# Patient Record
Sex: Female | Born: 1965 | Race: Black or African American | Hispanic: No | Marital: Single | Smoking: Never smoker
Health system: Southern US, Community
[De-identification: ages and names within clinical notes are randomized; demographics above are authoritative.]

## PROBLEM LIST (undated history)

## (undated) DIAGNOSIS — D573 Sickle-cell trait: Secondary | ICD-10-CM

## (undated) DIAGNOSIS — M5126 Other intervertebral disc displacement, lumbar region: Secondary | ICD-10-CM

## (undated) DIAGNOSIS — Z8619 Personal history of other infectious and parasitic diseases: Secondary | ICD-10-CM

## (undated) DIAGNOSIS — G4700 Insomnia, unspecified: Secondary | ICD-10-CM

## (undated) HISTORY — DX: Insomnia, unspecified: G47.00

## (undated) HISTORY — DX: Personal history of other infectious and parasitic diseases: Z86.19

## (undated) HISTORY — DX: Other intervertebral disc displacement, lumbar region: M51.26

---

## 2011-10-29 ENCOUNTER — Ambulatory Visit (INDEPENDENT_AMBULATORY_CARE_PROVIDER_SITE_OTHER): Payer: 59 | Admitting: Physician Assistant

## 2011-10-29 ENCOUNTER — Encounter: Payer: Self-pay | Admitting: Physician Assistant

## 2011-10-29 VITALS — BP 118/72 | HR 75 | Ht 67.0 in | Wt 172.0 lb

## 2011-10-29 DIAGNOSIS — M5126 Other intervertebral disc displacement, lumbar region: Secondary | ICD-10-CM | POA: Insufficient documentation

## 2011-10-29 DIAGNOSIS — Z7689 Persons encountering health services in other specified circumstances: Secondary | ICD-10-CM

## 2011-10-29 DIAGNOSIS — F5101 Primary insomnia: Secondary | ICD-10-CM | POA: Insufficient documentation

## 2011-10-29 DIAGNOSIS — J45909 Unspecified asthma, uncomplicated: Secondary | ICD-10-CM | POA: Insufficient documentation

## 2011-10-29 DIAGNOSIS — G4700 Insomnia, unspecified: Secondary | ICD-10-CM

## 2011-10-29 MED ORDER — ZOLPIDEM TARTRATE ER 6.25 MG PO TBCR: 6.2500 mg | EXTENDED_RELEASE_TABLET | Freq: Every evening | ORAL | Status: DC | PRN

## 2011-10-29 NOTE — Progress Notes (Signed)
  Subjective:    Patient ID: Angela Benton, female    DOB: [birth year], 46 y.o.   MRN: [id number]  HPI Patient presents to the clinic to establish care. We reviewed past medical history together. She does have a history of herniated disc at L4 and L5 but the pain is well controlled today and has not bother her for the past couple of years. She has recently had a job change and her job is much more stressful. She also had a shift change. She was working 3rd shift and now she is working 1 shift. She has been having problems sleeping since October 2012. She goes to sleep with no problem around 9 and then wakes up at 1 ready to go. She usually stays up until she goes to work. She then starts to really get tired around 5. She has tried melatonin, tylenol pm, Excedrin pm and they work to go to sleep but do not help her stay asleep. She denies any heat or cold intolerances, heart palpitations, weight loss, hair falling out, or history of problems with her thyroid. She eats red meat along with lots of green vegetables and beans. She has been on Ambien in the past and it did work. She did not take it every night but long enough to get sleep cycle started and then she slowly stopped taking medication.    Review of Systems     Objective:   Physical Exam  Constitutional: She is oriented to person, place, and time. She appears well-developed and well-nourished.  HENT:  Head: Normocephalic and atraumatic.  Cardiovascular: Normal rate, regular rhythm and normal heart sounds.   Pulmonary/Chest: Effort normal and breath sounds normal.  Neurological: She is alert and oriented to person, place, and time.  Psychiatric: She has a normal mood and affect. Her behavior is normal.          Assessment & Plan:  Insomnia- Ambien 6.25mg  at bedtime. Consider a sleep time routine that is the same every night and relaxing such as warm bath, washing face, reading a book. Follow up in 1 month.

## 2011-10-29 NOTE — Patient Instructions (Addendum)
Start Ambien at night and recheck in 1-2 months. Consider night time routine to prepare for bed with bath, book, calm down time.

## 2011-11-06 ENCOUNTER — Telehealth: Payer: Self-pay | Admitting: *Deleted

## 2011-11-06 MED ORDER — ZOLPIDEM TARTRATE 5 MG PO TABS: 5.0000 mg | ORAL_TABLET | Freq: Every evening | ORAL | Status: DC | PRN

## 2011-11-06 NOTE — Telephone Encounter (Signed)
LMOM informing Pt of change. Rx faxed to pharm

## 2011-11-06 NOTE — Telephone Encounter (Signed)
Prescription sent to printer. Will you please fax prescription to pharmacy and call patient and let her know that prescription has been sent for regular ambien.

## 2011-11-06 NOTE — Telephone Encounter (Signed)
Pt calls stating she is not sleeping well in Ambien CR and would like to go on Ambien. She is not getting any rest and needs sleep. Please advise,

## 2012-01-17 ENCOUNTER — Telehealth: Payer: Self-pay | Admitting: Cardiology

## 2012-01-18 ENCOUNTER — Other Ambulatory Visit: Payer: Self-pay | Admitting: *Deleted

## 2012-01-18 MED ORDER — ZOLPIDEM TARTRATE 5 MG PO TABS: 5.0000 mg | ORAL_TABLET | Freq: Every evening | ORAL | Status: DC | PRN

## 2012-04-07 ENCOUNTER — Other Ambulatory Visit: Payer: Self-pay | Admitting: *Deleted

## 2012-04-07 MED ORDER — ZOLPIDEM TARTRATE 5 MG PO TABS: 5.0000 mg | ORAL_TABLET | Freq: Every evening | ORAL | Status: DC | PRN

## 2012-06-20 ENCOUNTER — Ambulatory Visit: Payer: 59 | Admitting: Physician Assistant

## 2012-06-20 ENCOUNTER — Ambulatory Visit (INDEPENDENT_AMBULATORY_CARE_PROVIDER_SITE_OTHER): Payer: 59 | Admitting: Family Medicine

## 2012-06-20 ENCOUNTER — Encounter: Payer: Self-pay | Admitting: Family Medicine

## 2012-06-20 VITALS — BP 121/81 | HR 81 | Wt 170.0 lb

## 2012-06-20 DIAGNOSIS — D573 Sickle-cell trait: Secondary | ICD-10-CM | POA: Insufficient documentation

## 2012-06-20 DIAGNOSIS — R5383 Other fatigue: Secondary | ICD-10-CM

## 2012-06-20 DIAGNOSIS — R5381 Other malaise: Secondary | ICD-10-CM

## 2012-06-20 MED ORDER — ZOLPIDEM TARTRATE 5 MG PO TABS: 5.0000 mg | ORAL_TABLET | Freq: Every evening | ORAL | Status: DC | PRN

## 2012-06-20 NOTE — Progress Notes (Signed)
CC: Angela Benton is a 46 y.o. female is here for Fatigue   Subjective: HPI:  Patient presents for months of fatigue and "sluggishness" that she says feels identical to how she felt when she had low iron and low vitamin D in the remote past. Her work schedule is irregular and she feels that this might be playing into her fatigue but she is sleeping well using Ambien and feels well rested in the mornings. She has a history of heavy menstrual bleeding but periods have been much more controlled from a volume standpoint since a radiofrequency ablation years ago. She denies depression, anxiety, nor mental disturbance. She states her diet could be better and this maybe contributing to decreased vitamin and mineral intake. She admits to not getting much sun exposure and is not taking any vitamins or minerals over-the-counter. She denies a history of sleep apnea, snoring. She denies shortness of breath, irregular heartbeat, fevers, chills, orthopnea, unintentional weight gain, bleeding or bruising, swollen lymph nodes, nor trouble fighting infections.  She carries a diagnosis of insomnia and has been using Ambien as needed, and she's very happy with the results of this. She's requesting a refill.   Review Of Systems Outlined In HPI  Past Medical History  Diagnosis Date  . Herniated lumbar intervertebral disc     L4 and L5  . Insomnia      Family History  Problem Relation Age of Onset  . Diabetes Mother      History  Substance Use Topics  . Smoking status: Never Smoker   . Smokeless tobacco: Not on file  . Alcohol Use: No     Objective: Filed Vitals:   06/20/12 1043  BP: 121/81  Pulse: 81    General: Alert and Oriented, No Acute Distress HEENT: Pupils equal, round, reactive to light. Conjunctivae clear. Moist mucous membranes  Neck supple without palpable lymphadenopathy nor abnormal masses. Lungs: Clear to auscultation bilaterally, no wheezing/ronchi/rales.  Comfortable work of  breathing. Good air movement. Cardiac: Regular rate and rhythm. Normal S1/S2.  No murmurs, rubs, nor gallops.   Extremities: No peripheral edema.  Strong peripheral pulses.  Mental Status: No depression, anxiety, nor agitation. Skin: Warm and dry.  Assessment & Plan: Angela Benton was seen today for fatigue.  Diagnoses and associated orders for this visit:  Sickle cell trait  Fatigue - Iron and TIBC - CBC - Vitamin D 25 hydroxy - Folate - B12  Other Orders - zolpidem (AMBIEN) 5 MG tablet; Take 1 tablet (5 mg total) by mouth at bedtime as needed for sleep.    she's not interested in a TSH, she's closely states she's only interested in determining if supplements are necessary for the above labs. His history of sickle cell trait. We'll check CBC to rule out anemia. She would like results called to her 491 cell phone.  Refill of Ambien a call her with results once available on Monday   Return if symptoms worsen or fail to improve.

## 2012-06-21 LAB — IRON AND TIBC: Iron: 56 ug/dL (ref 42–145)

## 2012-06-21 LAB — CBC
HCT: 41.1 % (ref 36.0–46.0)
MCHC: 35 g/dL (ref 30.0–36.0)
MCV: 77.3 fL — ABNORMAL LOW (ref 78.0–100.0)
RDW: 15 % (ref 11.5–15.5)
WBC: 9.1 10*3/uL (ref 4.0–10.5)

## 2012-06-21 LAB — VITAMIN B12: Vitamin B-12: 405 pg/mL (ref 211–911)

## 2012-06-24 ENCOUNTER — Telehealth: Payer: Self-pay | Admitting: Family Medicine

## 2012-06-24 DIAGNOSIS — E559 Vitamin D deficiency, unspecified: Secondary | ICD-10-CM | POA: Insufficient documentation

## 2012-06-24 MED ORDER — ERGOCALCIFEROL 1.25 MG (50000 UT) PO CAPS: ORAL_CAPSULE | ORAL | Status: DC

## 2012-06-24 NOTE — Telephone Encounter (Signed)
Discussed favorable labs with patient, VitD could be contributing to fatigue, will recheck after 8 weeks of 50k weekly regimen.

## 2012-08-25 ENCOUNTER — Telehealth: Payer: Self-pay | Admitting: Family Medicine

## 2012-08-25 MED ORDER — ZOLPIDEM TARTRATE 5 MG PO TABS: 5.0000 mg | ORAL_TABLET | Freq: Every evening | ORAL | Status: DC | PRN

## 2012-08-25 NOTE — Telephone Encounter (Signed)
Message copied by Laren Boom on Mon Aug 25, 2012  4:42 PM ------      Message from: [facility]      Created: Mon Aug 25, 2012  1:55 PM      Regarding: Cassondra Kube       Hello Dr. Ivan Anchors:      Could you send a refill on my Ambien to [facility].      Angela Benton

## 2012-08-25 NOTE — Telephone Encounter (Signed)
Hello Dr. Ivan Anchors: Could you send a refill on my Ambien to [facility]. Lorita

## 2012-10-14 ENCOUNTER — Encounter: Payer: Self-pay | Admitting: Family Medicine

## 2012-10-14 ENCOUNTER — Ambulatory Visit (INDEPENDENT_AMBULATORY_CARE_PROVIDER_SITE_OTHER): Payer: 59 | Admitting: Family Medicine

## 2012-10-14 ENCOUNTER — Telehealth: Payer: Self-pay | Admitting: *Deleted

## 2012-10-14 VITALS — BP 131/79 | HR 89 | Temp 98.0°F | Wt 170.0 lb

## 2012-10-14 DIAGNOSIS — B9689 Other specified bacterial agents as the cause of diseases classified elsewhere: Secondary | ICD-10-CM

## 2012-10-14 DIAGNOSIS — G4700 Insomnia, unspecified: Secondary | ICD-10-CM

## 2012-10-14 DIAGNOSIS — A499 Bacterial infection, unspecified: Secondary | ICD-10-CM

## 2012-10-14 DIAGNOSIS — J329 Chronic sinusitis, unspecified: Secondary | ICD-10-CM

## 2012-10-14 MED ORDER — ZOLPIDEM TARTRATE 10 MG PO TABS: 10.0000 mg | ORAL_TABLET | Freq: Every evening | ORAL | Status: DC | PRN

## 2012-10-14 MED ORDER — DOXYCYCLINE HYCLATE 100 MG PO TABS: ORAL_TABLET | ORAL | Status: AC

## 2012-10-14 MED ORDER — PSEUDOEPHEDRINE-GUAIFENESIN ER 60-600 MG PO TB12: ORAL_TABLET | ORAL | Status: DC

## 2012-10-14 MED ORDER — ZOLPIDEM TARTRATE 5 MG PO TABS: 5.0000 mg | ORAL_TABLET | Freq: Every evening | ORAL | Status: DC | PRN

## 2012-10-14 NOTE — Progress Notes (Signed)
CC: Angela Benton is a 47 y.o. female is here for URI   Subjective: HPI:  Patient presents with concerns of facial pressure and nasal congestion. This is been present for 2 days and rapidly getting worse. Is described as moderate to severe in severity. It is worse when lying down. Nothing seems to make it better or worse otherwise. She has tried Alka-Seltzer products without improvement. No other interventions. The nasal congestion is getting in the way of her sleep. She has trouble breathing out of her nose and has "continuous" nasal discharge out of both nares. Facial pressure is localized in the for head and nonradiating. She denies ocular or ear complaints. She denies cough, shortness of breath, chest pain, orthopnea. She has had subjective chills and fatigue but no fevers. No night sweats.  She mentions that the 5 mg of Ambien has not been effective as it has in the past. This past month she has had to take 2 tablets before bed less than most nights of the week. Biggest trouble seems to be getting to sleep rather than staying asleep in less she takes 10 mg total. Nothing makes insomnia better or worse other than that above.    Review Of Systems Outlined In HPI  Past Medical History  Diagnosis Date  . Herniated lumbar intervertebral disc     L4 and L5  . Insomnia      Family History  Problem Relation Age of Onset  . Diabetes Mother      History  Substance Use Topics  . Smoking status: Never Smoker   . Smokeless tobacco: Not on file  . Alcohol Use: No     Objective: Filed Vitals:   10/14/12 0959  BP: 131/79  Pulse: 89  Temp: 98 F (36.7 C)    General: Alert and Oriented, No Acute Distress HEENT: Pupils equal, round, reactive to light. Conjunctivae clear.  External ears unremarkable, canals clear with intact TMs with appropriate landmarks.  Middle ear appears open without effusion. Boggy and erythematous inferior turbinates with moderate mucoid discharge.  Moist mucous  membranes, pharynx without inflammation nor lesions.  Neck supple without palpable lymphadenopathy nor abnormal masses. Lungs: Clear to auscultation bilaterally, no wheezing/ronchi/rales.  Comfortable work of breathing. Good air movement. Cardiac: Regular rate and rhythm. Normal S1/S2.  No murmurs, rubs, nor gallops.   Extremities: No peripheral edema.  Strong peripheral pulses.  Mental Status: No depression, anxiety, nor agitation. Skin: Warm and dry.  Assessment & Plan: Angela Benton was seen today for uri.  Diagnoses and associated orders for this visit:  Insomnia - zolpidem (AMBIEN) 10 MG tablet; Take 1 tablet (10 mg total) by mouth at bedtime as needed for sleep.  Bacterial sinusitis - doxycycline (VIBRA-TABS) 100 MG tablet; One by mouth twice a day for ten days. - pseudoephedrine-guaifenesin (MUCINEX D) 60-600 MG per tablet; One by mouth twice a day for congestion.  Sinusitis: Discussed with patient that most likely viral in etiology symptomatic care would include continued Alka-Seltzer products and/or Mucinex D for congestion.  If present for 7-10 days or worsening fevers can consider starting doxycycline. Encouraged to use nasal saline washes for the time being. Insomnia: Uncontrolled, titrate up to 10 mg as needed every evening.   Return if symptoms worsen or fail to improve.

## 2012-11-10 ENCOUNTER — Other Ambulatory Visit: Payer: Self-pay | Admitting: *Deleted

## 2012-11-10 DIAGNOSIS — G4700 Insomnia, unspecified: Secondary | ICD-10-CM

## 2012-11-10 MED ORDER — ZOLPIDEM TARTRATE 10 MG PO TABS: 10.0000 mg | ORAL_TABLET | Freq: Every evening | ORAL | Status: DC | PRN

## 2012-12-08 ENCOUNTER — Other Ambulatory Visit: Payer: Self-pay | Admitting: *Deleted

## 2012-12-08 DIAGNOSIS — G4700 Insomnia, unspecified: Secondary | ICD-10-CM

## 2012-12-11 ENCOUNTER — Other Ambulatory Visit: Payer: Self-pay | Admitting: *Deleted

## 2012-12-11 DIAGNOSIS — G4700 Insomnia, unspecified: Secondary | ICD-10-CM

## 2012-12-11 MED ORDER — ZOLPIDEM TARTRATE 10 MG PO TABS: 10.0000 mg | ORAL_TABLET | Freq: Every evening | ORAL | Status: DC | PRN

## 2013-01-08 ENCOUNTER — Other Ambulatory Visit: Payer: Self-pay | Admitting: Family Medicine

## 2013-01-08 DIAGNOSIS — G4700 Insomnia, unspecified: Secondary | ICD-10-CM

## 2013-01-08 NOTE — Telephone Encounter (Signed)
Sue Lush, Can you please let mrs. Charbonneau know that this Rx requires follow up every 3 months, I'll print out one for this month to give her time for follow up.

## 2013-01-08 NOTE — Telephone Encounter (Signed)
Pt notified and rx faxed 

## 2013-01-08 NOTE — Telephone Encounter (Signed)
Patient called req to get a refill for her prescription but would like to know if she can get a 2 month prescription instead of 1 month called into pharmacy... Her wrk number [phone] ext [phone]. Just said if you can call her and just let her know prescription has been called in she hasnt slept in 2 days. Thanks

## 2013-01-15 ENCOUNTER — Encounter: Payer: Self-pay | Admitting: Family Medicine

## 2013-01-15 ENCOUNTER — Ambulatory Visit (INDEPENDENT_AMBULATORY_CARE_PROVIDER_SITE_OTHER): Payer: 59 | Admitting: Family Medicine

## 2013-01-15 VITALS — BP 110/64 | HR 92 | Wt 170.0 lb

## 2013-01-15 DIAGNOSIS — G4700 Insomnia, unspecified: Secondary | ICD-10-CM

## 2013-01-15 MED ORDER — ZOLPIDEM TARTRATE 10 MG PO TABS: 10.0000 mg | ORAL_TABLET | Freq: Every evening | ORAL | Status: DC | PRN

## 2013-01-15 NOTE — Progress Notes (Signed)
CC: Angela Benton is a 47 y.o. female is here for f/u ambien   Subjective: HPI:  Followup insomnia: At last visit increased Ambien 5 to 10 mg nightly. Reports significant improvement in insomnia, no longer trouble falling asleep or staying asleep. Reports once answering the phone and the other party report she was talking about aliens, otherwise no reports of sleep walking or dangerous situations after taking Ambien. She's been taking it the moment she's gets in the bed. Denies depression, anxiety, paranoia, hallucinations, mental disturbance    Review Of Systems Outlined In HPI  Past Medical History  Diagnosis Date  . Herniated lumbar intervertebral disc     L4 and L5  . Insomnia      Family History  Problem Relation Age of Onset  . Diabetes Mother      History  Substance Use Topics  . Smoking status: Never Smoker   . Smokeless tobacco: Not on file  . Alcohol Use: No     Objective: Filed Vitals:   01/15/13 0913  BP: 110/64  Pulse: 92    Vital signs reviewed. General: Alert and Oriented, No Acute Distress HEENT: Pupils equal, round, reactive to light. Conjunctivae clear.  External ears unremarkable.  Moist mucous membranes. Lungs: Clear and comfortable work of breathing, speaking in full sentences without accessory muscle use. No wheezing rhonchi nor rales Cardiac: Regular rate and rhythm. No murmurs nor carotid bruit Neuro: CN II-XII grossly intact, gait normal. Extremities: No peripheral edema.  Strong peripheral pulses.  Mental Status: No depression, anxiety, nor agitation. Logical though process. Skin: Warm and dry.  Assessment & Plan: Angela Benton was seen today for f/u ambien.  Diagnoses and associated orders for this visit:  Insomnia - zolpidem (AMBIEN) 10 MG tablet; Take 1 tablet (10 mg total) by mouth at bedtime as needed for sleep.    Insomnia: Improved and controlled, continue as needed Ambien, take only once in bed ready for sleep  Return in about 3  months (around 04/17/2013).

## 2013-02-12 ENCOUNTER — Encounter: Payer: Self-pay | Admitting: Family Medicine

## 2013-03-24 ENCOUNTER — Ambulatory Visit: Payer: Self-pay | Admitting: Family Medicine

## 2013-04-16 ENCOUNTER — Ambulatory Visit: Payer: 59 | Admitting: Family Medicine

## 2013-04-17 ENCOUNTER — Encounter: Payer: Self-pay | Admitting: Family Medicine

## 2013-04-17 ENCOUNTER — Ambulatory Visit (INDEPENDENT_AMBULATORY_CARE_PROVIDER_SITE_OTHER): Payer: 59 | Admitting: Family Medicine

## 2013-04-17 VITALS — BP 111/77 | HR 73 | Wt 172.0 lb

## 2013-04-17 DIAGNOSIS — Z13220 Encounter for screening for lipoid disorders: Secondary | ICD-10-CM

## 2013-04-17 DIAGNOSIS — G4700 Insomnia, unspecified: Secondary | ICD-10-CM

## 2013-04-17 DIAGNOSIS — Z131 Encounter for screening for diabetes mellitus: Secondary | ICD-10-CM

## 2013-04-17 MED ORDER — ZOLPIDEM TARTRATE 10 MG PO TABS: 10.0000 mg | ORAL_TABLET | Freq: Every evening | ORAL | Status: DC | PRN

## 2013-04-17 NOTE — Progress Notes (Signed)
CC: Angela Benton is a 47 y.o. female is here for Insomnia   Subjective: HPI:  Followup insomnia: Since increasing Ambien to 10 mg he has noticed great improvement with getting to sleep and staying asleep estimated 6-7 hours a night feeling well rested in the morning without grogginess. She denies memory loss, mental disturbance nor sleep walking. She occasionally takes 5 mg and has been getting success with that as well. Symptoms are worsened with a regular work schedule.   Review Of Systems Outlined In HPI  Past Medical History  Diagnosis Date  . Herniated lumbar intervertebral disc     L4 and L5  . Insomnia      Family History  Problem Relation Age of Onset  . Diabetes Mother      History  Substance Use Topics  . Smoking status: Never Smoker   . Smokeless tobacco: Not on file  . Alcohol Use: No     Objective: Filed Vitals:   04/17/13 1106  BP: 111/77  Pulse: 73    Vital signs reviewed. General: Alert and Oriented, No Acute Distress HEENT: Pupils equal, round, reactive to light. Conjunctivae clear.  External ears unremarkable.  Moist mucous membranes. Lungs: Clear and comfortable work of breathing, speaking in full sentences without accessory muscle use. Cardiac: Regular rate and rhythm.  Neuro: CN II-XII grossly intact, gait normal. Extremities: No peripheral edema.  Strong peripheral pulses.  Mental Status: No depression, anxiety, nor agitation. Logical though process. Skin: Warm and dry. Assessment & Plan: [name] was seen today for insomnia.  Diagnoses and associated orders for this visit:  Insomnia - zolpidem (AMBIEN) 10 MG tablet; Take 1 tablet (10 mg total) by mouth at bedtime as needed for sleep.  Lipid screening - Lipid panel  Diabetes mellitus screening - BASIC METABOLIC PANEL WITH GFR    Insomnia: Controlled continue as needed Ambien we discussed possibly tapering off this in the near future when work schedule is more predictable  She's never  had a lipid screening or diabetic screen with [place] she will have this done at her convenience in the future fasting  Return in about 3 months (around 07/18/2013).

## 2013-05-10 ENCOUNTER — Telehealth: Payer: Self-pay | Admitting: Family Medicine

## 2013-05-10 MED ORDER — POLYMYXIN B-TRIMETHOPRIM 10000-0.1 UNIT/ML-% OP SOLN: 1.0000 [drp] | OPHTHALMIC | Status: DC

## 2013-05-10 NOTE — ED Notes (Signed)
L eye irritation x 2 days with mild drainage  No eye pain  No LOV.  Will Rx polytrim for infectious coverage if sxs worsen.  Likely viral conjunctivitis.   Doree Albee, MD 05/10/13 1314

## 2013-07-02 ENCOUNTER — Ambulatory Visit (HOSPITAL_COMMUNITY): Payer: 59 | Admitting: Behavioral Health

## 2013-07-09 ENCOUNTER — Other Ambulatory Visit: Payer: Self-pay

## 2013-07-10 ENCOUNTER — Encounter: Payer: Self-pay | Admitting: Family Medicine

## 2013-07-10 ENCOUNTER — Ambulatory Visit (INDEPENDENT_AMBULATORY_CARE_PROVIDER_SITE_OTHER): Payer: 59 | Admitting: Family Medicine

## 2013-07-10 VITALS — BP 130/79 | HR 85 | Wt 176.0 lb

## 2013-07-10 DIAGNOSIS — G4700 Insomnia, unspecified: Secondary | ICD-10-CM

## 2013-07-10 DIAGNOSIS — M5126 Other intervertebral disc displacement, lumbar region: Secondary | ICD-10-CM

## 2013-07-10 MED ORDER — ZOLPIDEM TARTRATE 10 MG PO TABS: 10.0000 mg | ORAL_TABLET | Freq: Every evening | ORAL | Status: DC | PRN

## 2013-07-10 MED ORDER — CYCLOBENZAPRINE HCL 10 MG PO TABS: ORAL_TABLET | ORAL | Status: DC

## 2013-07-10 MED ORDER — NAPROXEN 500 MG PO TBEC: 500.0000 mg | DELAYED_RELEASE_TABLET | Freq: Two times a day (BID) | ORAL | Status: DC

## 2013-07-10 NOTE — Progress Notes (Signed)
CC: Angela Benton is a 47 y.o. female is here for f/u ambien   Subjective: HPI:  F/U insomnia: Continues on Ambien 10 mg the site of the week getting 7 hours of sleep no known side effects. Feels well rested in the morning. No sleep walking or nightmares. If She does not take full dose she will only get 3 hours of sleep. No mental disturbance anxiety no depression.  Requesting refills on Flexeril and Naprosyn For low back pain attributed to herniated disc no new neurologic issues, no weakness no radiation of pain no bowel or bladder dysfunction. Overall character and severity of pain has been stable over the past 3 years    Review Of Systems Outlined In HPI  Past Medical History  Diagnosis Date  . Herniated lumbar intervertebral disc     L4 and L5  . Insomnia      Family History  Problem Relation Age of Onset  . Diabetes Mother      History  Substance Use Topics  . Smoking status: Never Smoker   . Smokeless tobacco: Not on file  . Alcohol Use: No     Objective: Filed Vitals:   07/10/13 0833  BP: 130/79  Pulse: 85    Vital signs reviewed. General: Alert and Oriented, No Acute Distress HEENT: Pupils equal, round, reactive to light. Conjunctivae clear.  External ears unremarkable.  Moist mucous membranes. Lungs: Clear and comfortable work of breathing, speaking in full sentences without accessory muscle use. Cardiac: Regular rate and rhythm.  Neuro: CN II-XII grossly intact, gait normal. Extremities: No peripheral edema.  Strong peripheral pulses.  Mental Status: No depression, anxiety, nor agitation. Logical though process. Skin: Warm and dry.  Assessment & Plan: Angela Benton was seen today for f/u ambien.  Diagnoses and associated orders for this visit:  Insomnia - zolpidem (AMBIEN) 10 MG tablet; Take 1 tablet (10 mg total) by mouth at bedtime as needed for sleep. - naproxen (EC NAPROSYN) 500 MG EC tablet; Take 1 tablet (500 mg total) by mouth 2 (two) times daily with  a meal.  Herniated lumbar intervertebral disc - cyclobenzaprine (FLEXERIL) 10 MG tablet; Take a half to a full tab every 8-12 hours only as needed for back pain, may cause sedation.    Insomnia: Well-controlled continue Ambien as needed   herniated disc: Stable continue as needed Flexeril and Naprosyn.Signs and symptoms requring emergent/urgent reevaluation were discussed with the patient.  Return in about 3 months (around 10/10/2013).

## 2013-07-17 ENCOUNTER — Ambulatory Visit: Payer: 59 | Admitting: Family Medicine

## 2013-07-28 ENCOUNTER — Other Ambulatory Visit: Payer: Self-pay | Admitting: Obstetrics & Gynecology

## 2013-07-28 MED ORDER — AMOXICILLIN-POT CLAVULANATE 875-125 MG PO TABS: 1.0000 | ORAL_TABLET | Freq: Two times a day (BID) | ORAL | Status: DC

## 2013-07-28 NOTE — Progress Notes (Signed)
Pt has right upper tooth abscess.  Pt seeing a dentist but doesn't have an appt for extraction until next Wednesday.  Will start Augmentin bid for 10 days.  If spikes fever or feels worse, pt to call dentist or go to ED.

## 2013-08-25 ENCOUNTER — Telehealth: Payer: Self-pay | Admitting: *Deleted

## 2013-08-25 DIAGNOSIS — N898 Other specified noninflammatory disorders of vagina: Secondary | ICD-10-CM

## 2013-08-25 MED ORDER — FLUCONAZOLE 150 MG PO TABS: 150.0000 mg | ORAL_TABLET | Freq: Once | ORAL | Status: DC

## 2013-08-25 MED ORDER — METRONIDAZOLE 500 MG PO TABS: 500.0000 mg | ORAL_TABLET | Freq: Two times a day (BID) | ORAL | Status: DC

## 2013-08-25 NOTE — Telephone Encounter (Signed)
Pt states she has bacterial infection - will send meds to pharm

## 2013-10-09 ENCOUNTER — Ambulatory Visit (INDEPENDENT_AMBULATORY_CARE_PROVIDER_SITE_OTHER): Payer: 59 | Admitting: Family Medicine

## 2013-10-09 ENCOUNTER — Encounter: Payer: Self-pay | Admitting: Family Medicine

## 2013-10-09 VITALS — BP 152/91 | HR 98 | Wt 181.0 lb

## 2013-10-09 DIAGNOSIS — IMO0001 Reserved for inherently not codable concepts without codable children: Secondary | ICD-10-CM

## 2013-10-09 DIAGNOSIS — G4700 Insomnia, unspecified: Secondary | ICD-10-CM

## 2013-10-09 DIAGNOSIS — R030 Elevated blood-pressure reading, without diagnosis of hypertension: Secondary | ICD-10-CM

## 2013-10-09 MED ORDER — ZOLPIDEM TARTRATE 10 MG PO TABS: 10.0000 mg | ORAL_TABLET | Freq: Every evening | ORAL | Status: DC | PRN

## 2013-10-09 NOTE — Progress Notes (Signed)
CC: Angela Benton is a 48 y.o. female is here for f/u ambien   Subjective: HPI:  Followup insomnia: Continues to take him to a nightly basis. She denies any sleepwalking, mental disturbance, anxiety, depression.  She will occasionally not take this medication however she finds that she has difficulty with getting to sleep and staying asleep on these days. She is quite happy with her current medication regimen.  Denies nonrestorative sleep, fatigue.   elevated blood pressure: She has never been on antihypertensives. She states that she did have a large meal at [employer] last night.  She exercises most days of the week. Denies chest pain shortness of breath or motor sensory disturbances     Review Of Systems Outlined In HPI  Past Medical History  Diagnosis Date  . Herniated lumbar intervertebral disc     L4 and L5  . Insomnia     Past Surgical History  Procedure Laterality Date  . Cesarean section     Family History  Problem Relation Age of Onset  . Diabetes Mother     History   Social History  . Marital Status: Single    Spouse Name: N/A    Number of Children: N/A  . Years of Education: N/A   Occupational History  . Not on file.   Social History Main Topics  . Smoking status: Never Smoker   . Smokeless tobacco: Not on file  . Alcohol Use: No  . Drug Use: No  . Sexual Activity: Not Currently   Other Topics Concern  . Not on file   Social History Narrative  . No narrative on file     Objective: BP 152/91  Pulse 98  Wt 181 lb (82.101 kg)  General: Alert and Oriented, No Acute Distress HEENT: Pupils equal, round, reactive to light. Conjunctivae clear.   moist membranes  Lungs: Clear to auscultation bilaterally, no wheezing/ronchi/rales.  Comfortable work of breathing. Good air movement. Cardiac: Regular rate and rhythm. Normal S1/S2.  No murmurs, rubs, nor gallops.   Mental Status: No depression, anxiety, nor agitation. Skin: Warm and dry.  Assessment &  Plan: Angela Benton was seen today for f/u ambien.  Diagnoses and associated orders for this visit:  Insomnia - zolpidem (AMBIEN) 10 MG tablet; Take 1 tablet (10 mg total) by mouth at bedtime as needed for sleep.  Elevated blood pressure    Insomnia: Controlled continue Ambien Elevated blood pressure: We discussed salt reduction in physical activity to help lower blood pressure. Asked her to drop and on one for breaks to have her blood pressure check sometime in the near future, highly suspect this is due to her meal last night  Return in about 3 months (around 01/06/2014).

## 2014-01-01 ENCOUNTER — Ambulatory Visit (INDEPENDENT_AMBULATORY_CARE_PROVIDER_SITE_OTHER): Payer: 59 | Admitting: Family Medicine

## 2014-01-01 ENCOUNTER — Encounter: Payer: Self-pay | Admitting: Family Medicine

## 2014-01-01 VITALS — BP 146/87 | HR 98 | Ht 67.0 in | Wt 188.0 lb

## 2014-01-01 DIAGNOSIS — Z131 Encounter for screening for diabetes mellitus: Secondary | ICD-10-CM

## 2014-01-01 DIAGNOSIS — Z0000 Encounter for general adult medical examination without abnormal findings: Secondary | ICD-10-CM

## 2014-01-01 DIAGNOSIS — E669 Obesity, unspecified: Secondary | ICD-10-CM

## 2014-01-01 DIAGNOSIS — G4700 Insomnia, unspecified: Secondary | ICD-10-CM

## 2014-01-01 DIAGNOSIS — Z13220 Encounter for screening for lipoid disorders: Secondary | ICD-10-CM

## 2014-01-01 MED ORDER — ZOLPIDEM TARTRATE 10 MG PO TABS: 10.0000 mg | ORAL_TABLET | Freq: Every evening | ORAL | Status: DC | PRN

## 2014-01-01 NOTE — Progress Notes (Signed)
Colonoscopy:No family history colon cancer, will begin screening age 48 Papsmear: Over a year, she plans on having this done with her OB/GYN in the next year. Mammogram: She would prefer to wait until age 48 to begin these screenings  Influenza Vaccine: Up-to-date Pneumovax: No current indication Td/Tdap: Tdap 2012 Zoster: (Start 48 yo)   CC: Angela Benton is a 48 y.o. female is here for No chief complaint on file.   Subjective: HPI:  Presents for physical with acute complaint of difficulty losing weight over the past months. No formal exercise routine. She admits difficulty with curving cravings for pancakes and soda.   No alcohol, tobacco, nor recreational drug use.  Review of Systems - General ROS: negative for - chills, fever, night sweats, weight gain or weight loss Ophthalmic ROS: negative for - decreased vision Psychological ROS: negative for - anxiety or depression ENT ROS: negative for - hearing change, nasal congestion, tinnitus or allergies Hematological and Lymphatic ROS: negative for - bleeding problems, bruising or swollen lymph nodes Breast ROS: negative Respiratory ROS: no cough, shortness of breath, or wheezing Cardiovascular ROS: no chest pain or dyspnea on exertion Gastrointestinal ROS: no abdominal pain, change in bowel habits, or black or bloody stools Genito-Urinary ROS: negative for - genital discharge, genital ulcers, incontinence or abnormal bleeding from genitals Musculoskeletal ROS: negative for - joint pain or muscle pain Neurological ROS: negative for - headaches or memory loss Dermatological ROS: negative for lumps, mole changes, rash and skin lesion changes  Past Medical History  Diagnosis Date  . Herniated lumbar intervertebral disc     L4 and L5  . Insomnia     Past Surgical History  Procedure Laterality Date  . Cesarean section     Family History  Problem Relation Age of Onset  . Diabetes Mother     History   Social History  .  Marital Status: Single    Spouse Name: N/A    Number of Children: N/A  . Years of Education: N/A   Occupational History  . Not on file.   Social History Main Topics  . Smoking status: Never Smoker   . Smokeless tobacco: Not on file  . Alcohol Use: No  . Drug Use: No  . Sexual Activity: Not Currently   Other Topics Concern  . Not on file   Social History Narrative  . No narrative on file     Objective: BP 146/87  Pulse 98  Ht 5\' 7"  (1.702 m)  Wt 188 lb (85.276 kg)  BMI 29.44 kg/m2  General: No Acute Distress HEENT: Atraumatic, normocephalic, conjunctivae normal without scleral icterus.  No nasal discharge, hearing grossly intact, TMs with good landmarks bilaterally with no middle ear abnormalities, posterior pharynx clear without oral lesions. Neck: Supple, trachea midline, no cervical nor supraclavicular adenopathy. Pulmonary: Clear to auscultation bilaterally without wheezing, rhonchi, nor rales. Cardiac: Regular rate and rhythm.  No murmurs, rubs, nor gallops. No peripheral edema.  2+ peripheral pulses bilaterally. Abdomen: Bowel sounds normal.  No masses.  Non-tender without rebound.  Negative Murphy's sign. GU: Deferred to her GYN appointment  MSK: Grossly intact, no signs of weakness.  Full strength throughout upper and lower extremities.  Full ROM in upper and lower extremities.  No midline spinal tenderness. Neuro: Gait unremarkable, CN II-XII grossly intact.  C5-C6 Reflex 2/4 Bilaterally, L4 Reflex 2/4 Bilaterally.  Cerebellar function intact. Skin: No rashes. Psych: Alert and oriented to person/place/time.  Thought process normal. No anxiety/depression.   Assessment &  Plan: Angela Benton was seen today for no specified reason.  Diagnoses and associated orders for this visit:  Annual physical exam  Lipid screening - Lipid panel  Diabetes mellitus screening - BASIC METABOLIC PANEL WITH GFR  Insomnia - zolpidem (AMBIEN) 10 MG tablet; Take 1 tablet (10 mg total)  by mouth at bedtime as needed for sleep.  Obesity - Ambulatory referral to diabetic education    Healthy lifestyle interventions including but not limited to regular exercise, a healthy low fat diet, moderation of salt intake, the dangers of tobacco/alcohol/recreational drug use, nutrition supplementation, and accident avoidance were discussed with the patient and a handout was provided for future reference. She'll return at her convenience for dyslipidemia and diabetic screening. Referral to nutrition has been placed for her request, I've encouraged her to look into [employer], and to cut out soda 100%  Return in about 3 months (around 04/03/2014).

## 2014-02-01 ENCOUNTER — Encounter: Payer: Self-pay | Admitting: Sports Medicine

## 2014-02-01 ENCOUNTER — Ambulatory Visit (INDEPENDENT_AMBULATORY_CARE_PROVIDER_SITE_OTHER): Payer: 59

## 2014-02-01 ENCOUNTER — Ambulatory Visit (INDEPENDENT_AMBULATORY_CARE_PROVIDER_SITE_OTHER): Payer: 59 | Admitting: Sports Medicine

## 2014-02-01 VITALS — BP 135/83 | HR 103 | Wt 188.0 lb

## 2014-02-01 DIAGNOSIS — IMO0002 Reserved for concepts with insufficient information to code with codable children: Secondary | ICD-10-CM

## 2014-02-01 DIAGNOSIS — M5137 Other intervertebral disc degeneration, lumbosacral region: Secondary | ICD-10-CM

## 2014-02-01 DIAGNOSIS — M5380 Other specified dorsopathies, site unspecified: Secondary | ICD-10-CM

## 2014-02-01 DIAGNOSIS — M5416 Radiculopathy, lumbar region: Secondary | ICD-10-CM | POA: Insufficient documentation

## 2014-02-01 MED ORDER — PREDNISONE 50 MG PO TABS: ORAL_TABLET | ORAL | Status: DC

## 2014-02-01 MED ORDER — CYCLOBENZAPRINE HCL 10 MG PO TABS: ORAL_TABLET | ORAL | Status: DC

## 2014-02-01 NOTE — Assessment & Plan Note (Signed)
Symptoms are classic for a right-sided L5 radiculitis. Formal physical therapy, prednisone, continue naproxen, refilling cyclobenzaprine. Lumbar spine x-rays. Return to see me in one month.

## 2014-02-01 NOTE — Progress Notes (Signed)
   Subjective:    I'm seeing this patient as a consultation for:  Dr. Ivan Anchors  CC: Low back pain  HPI: This is a pleasant 48 year old female, for the past several weeks she's had increasing pain in her low back, which is minimal, her predominant symptom is right leg pain she describes an L5 distribution. It sounds as though in her 9 years ago she had an MRI that showed L4-5 and L5-S1 disc disease. Symptoms are worse with sitting, flexion, Valsalva. No bowel or bladder dysfunction, no saddle numbness.  Past medical history, Surgical history, Family history not pertinant except as noted below, Social history, Allergies, and medications have been entered into the medical record, reviewed, and no changes needed.   Review of Systems: No headache, visual changes, nausea, vomiting, diarrhea, constipation, dizziness, abdominal pain, skin rash, fevers, chills, night sweats, weight loss, swollen lymph nodes, body aches, joint swelling, muscle aches, chest pain, shortness of breath, mood changes, visual or auditory hallucinations.   Objective:   General: Well Developed, well nourished, and in no acute distress.  Neuro/Psych: Alert and oriented x3, extra-ocular muscles intact, able to move all 4 extremities, sensation grossly intact. Skin: Warm and dry, no rashes noted.  Respiratory: Not using accessory muscles, speaking in full sentences, trachea midline.  Cardiovascular: Pulses palpable, no extremity edema. Abdomen: Does not appear distended. Back Exam:  Inspection: Unremarkable  Motion: Flexion 45 deg, Extension 45 deg, Side Bending to 45 deg bilaterally,  Rotation to 45 deg bilaterally  SLR laying: Negative  XSLR laying: Negative  Palpable tenderness: None. FABER: negative. Sensory change: Gross sensation intact to all lumbar and sacral dermatomes.  Reflexes: 2+ at both patellar tendons, 2+ at achilles tendons, Babinski's downgoing.  Strength at foot  Plantar-flexion: 5/5 Dorsi-flexion: 5/5  Eversion: 5/5 Inversion: 5/5  Leg strength  Quad: 5/5 Hamstring: 5/5 Hip flexor: 5/5 Hip abductors: 5/5  Gait unremarkable.   X-rays personally reviewed show a significant decrease in disc space at the L4-L5 level with multilevel anterior disc osteophytic spurring.  Impression and Recommendations:   This case required medical decision making of moderate complexity.

## 2014-02-03 ENCOUNTER — Ambulatory Visit: Payer: 59 | Admitting: *Deleted

## 2014-02-03 DIAGNOSIS — N390 Urinary tract infection, site not specified: Secondary | ICD-10-CM

## 2014-02-03 NOTE — Progress Notes (Signed)
Pt feels pressure when urinating.

## 2014-02-04 LAB — POCT URINALYSIS DIP (DEVICE)
Glucose, UA: NEGATIVE mg/dL
LEUKOCYTES UA: NEGATIVE
NITRITE: NEGATIVE
PH: 6 (ref 5.0–8.0)
PROTEIN: 100 mg/dL — AB
Specific Gravity, Urine: >=1.03 (ref 1.005–1.030)
Urobilinogen, UA: 0.2 mg/dL (ref 0.0–1.0)

## 2014-02-06 LAB — URINE CULTURE: Colony Count: 50000

## 2014-02-19 NOTE — Telephone Encounter (Signed)
done

## 2014-04-01 LAB — LIPID PANEL
CHOL/HDL RATIO: 4.1 ratio
Cholesterol: 158 mg/dL (ref 0–200)
HDL: 39 mg/dL — ABNORMAL LOW (ref >39)
LDL Cholesterol: 97 mg/dL (ref 0–99)
Triglycerides: 109 mg/dL (ref <150)
VLDL: 22 mg/dL (ref 0–40)

## 2014-04-01 LAB — BASIC METABOLIC PANEL WITH GFR
BUN: 6 mg/dL (ref 6–23)
CO2: 26 mEq/L (ref 19–32)
Calcium: 9.2 mg/dL (ref 8.4–10.5)
Chloride: 105 mEq/L (ref 96–112)
Creat: 0.63 mg/dL (ref 0.50–1.10)
Glucose, Bld: 87 mg/dL (ref 70–99)
POTASSIUM: 3.9 meq/L (ref 3.5–5.3)
SODIUM: 139 meq/L (ref 135–145)

## 2014-04-06 ENCOUNTER — Ambulatory Visit (INDEPENDENT_AMBULATORY_CARE_PROVIDER_SITE_OTHER): Payer: 59 | Admitting: Family Medicine

## 2014-04-06 ENCOUNTER — Encounter: Payer: Self-pay | Admitting: Family Medicine

## 2014-04-06 VITALS — BP 134/83 | HR 84 | Ht 67.0 in | Wt 192.0 lb

## 2014-04-06 DIAGNOSIS — M5416 Radiculopathy, lumbar region: Secondary | ICD-10-CM

## 2014-04-06 DIAGNOSIS — E785 Hyperlipidemia, unspecified: Secondary | ICD-10-CM | POA: Insufficient documentation

## 2014-04-06 DIAGNOSIS — IMO0002 Reserved for concepts with insufficient information to code with codable children: Secondary | ICD-10-CM

## 2014-04-06 DIAGNOSIS — E669 Obesity, unspecified: Secondary | ICD-10-CM

## 2014-04-06 DIAGNOSIS — G4700 Insomnia, unspecified: Secondary | ICD-10-CM

## 2014-04-06 MED ORDER — CYCLOBENZAPRINE HCL 10 MG PO TABS: 10.0000 mg | ORAL_TABLET | Freq: Three times a day (TID) | ORAL | Status: DC | PRN

## 2014-04-06 MED ORDER — PHENTERMINE HCL 37.5 MG PO TABS: 37.5000 mg | ORAL_TABLET | Freq: Every day | ORAL | Status: DC

## 2014-04-06 MED ORDER — ZOLPIDEM TARTRATE 10 MG PO TABS: 10.0000 mg | ORAL_TABLET | Freq: Every evening | ORAL | Status: DC | PRN

## 2014-04-06 NOTE — Progress Notes (Signed)
CC: Angela Benton is a 48 y.o. female is here for discuss appetite suppressant   Subjective: HPI:  Followup insomnia: Continues to be mostly to the week. States that it is still given her great ability to both fall asleep and staying asleep without any noted side effects. Denies sleepwalking. States she feels refreshed in the morning without any residual fatigue  Followup dyslipidemia: She has questions regarding her HDL being so slightly deficient when checked a few weeks ago. No formal exercise routine. She does try to eat fish and has been taking her oral but no other interventions for lipid management. Denies chest pain shortness of breath orthopnea nor peripheral edema  Right lumbar radiculitis: Patient states that symptoms have been greatly improved with as needed Flexeril since she saw Dr. Karie Schwalbe. in sports medicine. If she's had a physically demanding date symptoms will return described as burning that radiates down the right buttock into the right foot which improves with stretching or taking Flexeril. She denies any new motor or sensory disturbances.  Complains of difficulty with losing weight predominantly with portion control. States that she seems to be always be hungry has difficulty finding time for physical activity during the day.[name] She wants to know if there is medication to help with losing weight    Review Of Systems Outlined In HPI  Past Medical History  Diagnosis Date  . Herniated lumbar intervertebral disc     L4 and L5  . Insomnia     Past Surgical History  Procedure Laterality Date  . Cesarean section     Family History  Problem Relation Age of Onset  . Diabetes Mother     History   Social History  . Marital Status: Single    Spouse Name: N/A    Number of Children: N/A  . Years of Education: N/A   Occupational History  . Not on file.   Social History Main Topics  . Smoking status: Never Smoker   . Smokeless tobacco: Not on file  . Alcohol Use: No  .  Drug Use: No  . Sexual Activity: Not Currently   Other Topics Concern  . Not on file   Social History Narrative  . No narrative on file     Objective: BP 134/83  Pulse 84  Ht 5\' 7"  (1.702 m)  Wt 192 lb (87.091 kg)  BMI 30.06 kg/m2  General: Alert and Oriented, No Acute Distress HEENT: Pupils equal, round, reactive to light. Conjunctivae clear.  Moist mucous membranes pharynx unremarkable Lungs: Clear to auscultation bilaterally, no wheezing/ronchi/rales.  Comfortable work of breathing. Good air movement. Cardiac: Regular rate and rhythm. Normal S1/S2.  No murmurs, rubs, nor gallops.   Abdomen: Obese soft nontender Extremities: No peripheral edema.  Strong peripheral pulses.  Mental Status: No depression, anxiety, nor agitation. Skin: Warm and dry.  Assessment & Plan: Angela Benton was seen today for discuss appetite suppressant.  Diagnoses and associated orders for this visit:  Obesity, unspecified - phentermine (ADIPEX-P) 37.5 MG tablet; Take 1 tablet (37.5 mg total) by mouth daily before breakfast.  Dyslipidemia  Insomnia - zolpidem (AMBIEN) 10 MG tablet; Take 1 tablet (10 mg total) by mouth at bedtime as needed for sleep.  Right lumbar radiculitis - cyclobenzaprine (FLEXERIL) 10 MG tablet; Take 1 tablet (10 mg total) by mouth 3 (three) times daily as needed for muscle spasms.    Obesity: Uncontrolled starting phentermine with understanding that she must lose weight on a month to month basis to be prescribed  refills along with the requirement of blood pressure was not be dangerously increasing Dyslipidemia: Mildly uncontrolled, encouraged to begin engaging 30-45 minutes of moderate activity most days of the week.  Increase whole grains to help elevate HDL Right lumbar radiculitis: Improving with as needed cyclobenzaprine Insomnia: Controlled on Ambien   Return in about 4 weeks (around 05/04/2014) for Nurse Visit.

## 2014-05-03 ENCOUNTER — Ambulatory Visit (INDEPENDENT_AMBULATORY_CARE_PROVIDER_SITE_OTHER): Payer: 59 | Admitting: Family Medicine

## 2014-05-03 VITALS — BP 143/86 | Wt 188.0 lb

## 2014-05-03 DIAGNOSIS — R635 Abnormal weight gain: Secondary | ICD-10-CM

## 2014-05-03 DIAGNOSIS — E669 Obesity, unspecified: Secondary | ICD-10-CM

## 2014-05-03 MED ORDER — PHENTERMINE HCL 37.5 MG PO TABS: 37.5000 mg | ORAL_TABLET | Freq: Every day | ORAL | Status: DC

## 2014-05-03 NOTE — Progress Notes (Signed)
rx faxed and left message on vm

## 2014-05-03 NOTE — Progress Notes (Signed)
   Subjective:    Patient ID: Angela Benton, female    DOB: [birth year], 48 y.o.   MRN: [id number]  HPI  Angela Benton is here for a weight check and blood pressure check. Denies any medication problems, chest pain, shortness of breath or headaches.   Review of Systems     Objective:   Physical Exam        Assessment & Plan:  Patient has lost weight. She would like her prescription sent to [facility].

## 2014-05-03 NOTE — Progress Notes (Signed)
Reviewed.  Sue Lush, Rx placed in in-box ready for pickup/faxing.

## 2014-05-27 ENCOUNTER — Encounter: Payer: Self-pay | Admitting: Cardiology

## 2014-05-27 NOTE — Telephone Encounter (Signed)
New message           Maelle would like to ask you a question about an ekg machine

## 2014-05-27 NOTE — Telephone Encounter (Signed)
This encounter was created in error - please disregard.

## 2014-06-25 ENCOUNTER — Ambulatory Visit (INDEPENDENT_AMBULATORY_CARE_PROVIDER_SITE_OTHER): Payer: 59 | Admitting: Family Medicine

## 2014-06-25 ENCOUNTER — Encounter: Payer: Self-pay | Admitting: Family Medicine

## 2014-06-25 VITALS — BP 133/86 | HR 87 | Wt 186.0 lb

## 2014-06-25 DIAGNOSIS — G4700 Insomnia, unspecified: Secondary | ICD-10-CM

## 2014-06-25 DIAGNOSIS — R635 Abnormal weight gain: Secondary | ICD-10-CM | POA: Insufficient documentation

## 2014-06-25 DIAGNOSIS — E669 Obesity, unspecified: Secondary | ICD-10-CM

## 2014-06-25 MED ORDER — ZOLPIDEM TARTRATE 10 MG PO TABS: 10.0000 mg | ORAL_TABLET | Freq: Every evening | ORAL | Status: DC | PRN

## 2014-06-25 MED ORDER — PHENTERMINE HCL 37.5 MG PO TABS: 37.5000 mg | ORAL_TABLET | Freq: Every day | ORAL | Status: DC

## 2014-06-25 NOTE — Progress Notes (Signed)
CC: Angela Benton is a 48 y.o. female is here for f/u medications   Subjective: HPI:  Followup obesity: Continues to take phentermine most days of the week, she forgets to take this every once in a while. She denies any intolerance or side effects. Denies sleep disturbance, chest pain, paranoia, anxiety, nor irregular heartbeat. She's having success with portion control which he takes his medication on a daily basis. She's also begun working out most days of the week  Followup insomnia: Patient states no difficulty with falling asleep provided she takes Ambien on nightly basis. She denies side effects or sleep walking. She denies difficulty falling or staying asleep. Symptoms are not influenced by days that she takes phentermine or forgets to take phentermine.       Review Of Systems Outlined In HPI  Past Medical History  Diagnosis Date  . Herniated lumbar intervertebral disc     L4 and L5  . Insomnia     Past Surgical History  Procedure Laterality Date  . Cesarean section     Family History  Problem Relation Age of Onset  . Diabetes Mother     History   Social History  . Marital Status: Single    Spouse Name: N/A    Number of Children: N/A  . Years of Education: N/A   Occupational History  . Not on file.   Social History Main Topics  . Smoking status: Never Smoker   . Smokeless tobacco: Not on file  . Alcohol Use: No  . Drug Use: No  . Sexual Activity: Not Currently   Other Topics Concern  . Not on file   Social History Narrative  . No narrative on file     Objective: BP 133/86  Pulse 87  Wt 186 lb (84.369 kg)  General: Alert and Oriented, No Acute Distress HEENT: Pupils equal, round, reactive to light. Conjunctivae clear.   moist membranes pharynx unremarkable  Lungs: Clear to auscultation bilaterally, no wheezing/ronchi/rales.  Comfortable work of breathing. Good air movement. Cardiac: Regular rate and rhythm. Normal S1/S2.  No murmurs, rubs, nor  gallops.   Extremities: No peripheral edema.  Strong peripheral pulses.  Mental Status: No depression, anxiety, nor agitation. Skin: Warm and dry.  Assessment & Plan: Angela BraunKaren was seen today for f/u medications.  Diagnoses and associated orders for this visit:  Insomnia - zolpidem (AMBIEN) 10 MG tablet; Take 1 tablet (10 mg total) by mouth at bedtime as needed for sleep.  Abnormal weight gain  Obesity - phentermine (ADIPEX-P) 37.5 MG tablet; Take 1 tablet (37.5 mg total) by mouth daily before breakfast.    Insomnia: Controlled continue as needed Ambien Obesity: Improving with continued use of phentermine. Discussed dietary and exercise interventions that will also help with weight loss.   Return in about 3 months (around 09/25/2014).

## 2014-08-09 ENCOUNTER — Emergency Department (HOSPITAL_BASED_OUTPATIENT_CLINIC_OR_DEPARTMENT_OTHER): Payer: 59

## 2014-08-09 ENCOUNTER — Emergency Department (HOSPITAL_BASED_OUTPATIENT_CLINIC_OR_DEPARTMENT_OTHER)
Admission: EM | Admit: 2014-08-09 | Discharge: 2014-08-09 | Disposition: A | Discharge status: Home / Self Care | Payer: 59 | Attending: Emergency Medicine | Admitting: Emergency Medicine

## 2014-08-09 ENCOUNTER — Ambulatory Visit: Payer: 59 | Admitting: Family Medicine

## 2014-08-09 ENCOUNTER — Encounter (HOSPITAL_BASED_OUTPATIENT_CLINIC_OR_DEPARTMENT_OTHER): Payer: Self-pay | Admitting: Emergency Medicine

## 2014-08-09 DIAGNOSIS — Z8669 Personal history of other diseases of the nervous system and sense organs: Secondary | ICD-10-CM | POA: Insufficient documentation

## 2014-08-09 DIAGNOSIS — Z3202 Encounter for pregnancy test, result negative: Secondary | ICD-10-CM | POA: Insufficient documentation

## 2014-08-09 DIAGNOSIS — R109 Unspecified abdominal pain: Secondary | ICD-10-CM | POA: Diagnosis present

## 2014-08-09 DIAGNOSIS — Z8739 Personal history of other diseases of the musculoskeletal system and connective tissue: Secondary | ICD-10-CM | POA: Insufficient documentation

## 2014-08-09 DIAGNOSIS — N200 Calculus of kidney: Secondary | ICD-10-CM | POA: Insufficient documentation

## 2014-08-09 DIAGNOSIS — Z862 Personal history of diseases of the blood and blood-forming organs and certain disorders involving the immune mechanism: Secondary | ICD-10-CM | POA: Diagnosis not present

## 2014-08-09 DIAGNOSIS — Z9889 Other specified postprocedural states: Secondary | ICD-10-CM | POA: Diagnosis not present

## 2014-08-09 HISTORY — DX: Sickle-cell trait: D57.3

## 2014-08-09 LAB — CBC WITH DIFFERENTIAL/PLATELET
Basophils Absolute: 0 10*3/uL (ref 0.0–0.1)
Basophils Relative: 0 % (ref 0–1)
Eosinophils Absolute: 0 10*3/uL (ref 0.0–0.7)
Eosinophils Relative: 0 % (ref 0–5)
HEMATOCRIT: 41.8 % (ref 36.0–46.0)
HEMOGLOBIN: 15 g/dL (ref 12.0–15.0)
LYMPHS PCT: 9 % — AB (ref 12–46)
Lymphs Abs: 1.3 10*3/uL (ref 0.7–4.0)
MCH: 27.2 pg (ref 26.0–34.0)
MCHC: 35.9 g/dL (ref 30.0–36.0)
MCV: 75.7 fL — ABNORMAL LOW (ref 78.0–100.0)
MONO ABS: 0.6 10*3/uL (ref 0.1–1.0)
Monocytes Relative: 4 % (ref 3–12)
NEUTROS ABS: 13.8 10*3/uL — AB (ref 1.7–7.7)
NEUTROS PCT: 87 % — AB (ref 43–77)
Platelets: 216 10*3/uL (ref 150–400)
RBC: 5.52 MIL/uL — AB (ref 3.87–5.11)
RDW: 13.8 % (ref 11.5–15.5)
WBC: 15.8 10*3/uL — AB (ref 4.0–10.5)

## 2014-08-09 LAB — PREGNANCY, URINE: Preg Test, Ur: NEGATIVE

## 2014-08-09 LAB — COMPREHENSIVE METABOLIC PANEL
ALT: 29 U/L (ref 0–35)
ANION GAP: 14 (ref 5–15)
AST: 29 U/L (ref 0–37)
Albumin: 4 g/dL (ref 3.5–5.2)
Alkaline Phosphatase: 83 U/L (ref 39–117)
BUN: 6 mg/dL (ref 6–23)
CO2: 26 meq/L (ref 19–32)
CREATININE: 0.6 mg/dL (ref 0.50–1.10)
Calcium: 9.5 mg/dL (ref 8.4–10.5)
Chloride: 102 mEq/L (ref 96–112)
GFR calc Af Amer: >90 mL/min (ref >90)
Glucose, Bld: 101 mg/dL — ABNORMAL HIGH (ref 70–99)
POTASSIUM: 4 meq/L (ref 3.7–5.3)
Sodium: 142 mEq/L (ref 137–147)
Total Bilirubin: 0.7 mg/dL (ref 0.3–1.2)
Total Protein: 8.6 g/dL — ABNORMAL HIGH (ref 6.0–8.3)

## 2014-08-09 LAB — URINALYSIS, ROUTINE W REFLEX MICROSCOPIC
Bilirubin Urine: NEGATIVE
Glucose, UA: NEGATIVE mg/dL
Ketones, ur: NEGATIVE mg/dL
Nitrite: NEGATIVE
Protein, ur: 30 mg/dL — AB
SPECIFIC GRAVITY, URINE: 1.015 (ref 1.005–1.030)
UROBILINOGEN UA: 0.2 mg/dL (ref 0.0–1.0)
pH: 6 (ref 5.0–8.0)

## 2014-08-09 LAB — URINE MICROSCOPIC-ADD ON

## 2014-08-09 LAB — LIPASE, BLOOD: LIPASE: 27 U/L (ref 11–59)

## 2014-08-09 MED ORDER — SODIUM CHLORIDE 0.9 % IV BOLUS (SEPSIS)
1000.0000 mL | INTRAVENOUS | Status: AC
  Administered 2014-08-09: 1000 mL via INTRAVENOUS

## 2014-08-09 MED ORDER — MORPHINE SULFATE 4 MG/ML IJ SOLN
4.0000 mg | Freq: Once | INTRAMUSCULAR | Status: AC
  Administered 2014-08-09: 4 mg via INTRAVENOUS
  Filled 2014-08-09: qty 1

## 2014-08-09 MED ORDER — ONDANSETRON HCL 4 MG PO TABS: 4.0000 mg | ORAL_TABLET | Freq: Four times a day (QID) | ORAL | Status: DC

## 2014-08-09 MED ORDER — ONDANSETRON HCL 4 MG/2ML IJ SOLN
4.0000 mg | Freq: Once | INTRAMUSCULAR | Status: AC
  Administered 2014-08-09: 4 mg via INTRAVENOUS
  Filled 2014-08-09: qty 2

## 2014-08-09 MED ORDER — HYDROCODONE-ACETAMINOPHEN 5-325 MG PO TABS: 1.0000 | ORAL_TABLET | ORAL | Status: DC | PRN

## 2014-08-09 NOTE — Discharge Instructions (Signed)
Please follow the directions provided.  Be sure to follow-up with the urologist for further management of this kidney stone.  Use the strainer provided to strain your urine. Drink plenty of fluids.  Use your pain and nausea medicines as needed. Don't hesitate to return for any new, worsening or concerning symptoms.         SEEK IMMEDIATE MEDICAL CARE IF:  Pain cannot be controlled with the prescribed medicine.  You have a fever or shaking chills.  The severity or intensity of pain increases over 18 hours and is not relieved by pain medicine.  You develop a new onset of abdominal pain.  You feel faint or pass out.  You are unable to urinate.

## 2014-08-09 NOTE — ED Notes (Signed)
Pt attempting to find ride home at this time. 

## 2014-08-09 NOTE — ED Provider Notes (Signed)
CSN: [id number]     Arrival date & time 08/09/14  0901 History   First MD Initiated Contact with Patient 08/09/14 0932     Chief Complaint  Patient presents with  . Abdominal Pain    (Consider location/radiation/quality/duration/timing/severity/associated sxs/prior Treatment) HPI  Angela Benton is a 48 yo female presenting with report right sided flank pain. She states she was woken up by sudden onset right flank pain.  The pain became very severe and made her nauseated causing her to vomit several times. She notes the pain and vomiting made her feel chills but she has not noticed a fever.  Her LMP was two weeks ago and her last bowel movement was yesterday.  She denies any diarrhea, bloody/bilious emesis or melena or hematachezia.   Past Medical History  Diagnosis Date  . Herniated lumbar intervertebral disc     L4 and L5  . Insomnia   . Sickle cell trait    Past Surgical History  Procedure Laterality Date  . Cesarean section     Family History  Problem Relation Age of Onset  . Diabetes Mother    History  Substance Use Topics  . Smoking status: Never Smoker   . Smokeless tobacco: Not on file  . Alcohol Use: No   OB History    No data available     Review of Systems  Constitutional: Negative for fever and chills.  HENT: Negative for sore throat.   Eyes: Negative for visual disturbance.  Respiratory: Negative for cough and shortness of breath.   Cardiovascular: Negative for chest pain and leg swelling.  Gastrointestinal: Negative for nausea, vomiting and diarrhea.  Genitourinary: Positive for flank pain. Negative for dysuria.  Musculoskeletal: Negative for myalgias.  Skin: Negative for rash.  Neurological: Negative for weakness, numbness and headaches.    Allergies  Review of patient's allergies indicates no known allergies.  Home Medications   Prior to Admission medications   Medication Sig Start Date End Date Taking? Authorizing Provider  zolpidem (AMBIEN) 10  MG tablet Take 1 tablet (10 mg total) by mouth at bedtime as needed for sleep. 06/25/14   Sean Hommel, DO   BP 119/80 mmHg  Pulse 114  Temp(Src) 98.7 F (37.1 C) (Oral)  Resp 16  Wt 183 lb 4.8 oz (83.144 kg)  SpO2 100%  LMP 07/26/2014 Physical Exam  Constitutional: She appears well-developed and well-nourished. No distress.  HENT:  Head: Normocephalic and atraumatic.  Mouth/Throat: Oropharynx is clear and moist. No oropharyngeal exudate.  Eyes: Conjunctivae are normal.  Neck: Neck supple. No thyromegaly present.  Cardiovascular: Normal rate, regular rhythm and intact distal pulses.   Pulmonary/Chest: Effort normal and breath sounds normal. No respiratory distress. She has no wheezes. She has no rales. She exhibits no tenderness.  Abdominal: Soft. There is no hepatosplenomegaly. There is tenderness. There is no rigidity, no rebound, no guarding, no CVA tenderness, no tenderness at McBurney's point and negative Murphy's sign.    Musculoskeletal: She exhibits no tenderness.  Lymphadenopathy:    She has no cervical adenopathy.  Neurological: She is alert.  Skin: Skin is warm and dry. No rash noted. She is not diaphoretic.  Psychiatric: She has a normal mood and affect.  Nursing note and vitals reviewed.   ED Course  Procedures (including critical care time) Labs Review Labs Reviewed  CBC WITH DIFFERENTIAL - Abnormal; Notable for the following:    WBC 15.8 (*)    RBC 5.52 (*)    MCV 75.7 (*)  Neutrophils Relative % 87 (*)    Neutro Abs 13.8 (*)    Lymphocytes Relative 9 (*)    All other components within normal limits  COMPREHENSIVE METABOLIC PANEL - Abnormal; Notable for the following:    Glucose, Bld 101 (*)    Total Protein 8.6 (*)    All other components within normal limits  URINALYSIS, ROUTINE W REFLEX MICROSCOPIC - Abnormal; Notable for the following:    APPearance CLOUDY (*)    Hgb urine dipstick MODERATE (*)    Protein, ur 30 (*)    Leukocytes, UA TRACE (*)      All other components within normal limits  URINE MICROSCOPIC-ADD ON - Abnormal; Notable for the following:    Squamous Epithelial / LPF MANY (*)    Bacteria, UA MANY (*)    All other components within normal limits  LIPASE, BLOOD  PREGNANCY, URINE    Imaging Review Ct Renal Stone Study  08/09/2014   CLINICAL DATA:  Acute right flank pain since 3 a.m. this morning.  EXAM: CT ABDOMEN AND PELVIS WITHOUT CONTRAST  TECHNIQUE: Multidetector CT imaging of the abdomen and pelvis was performed following the standard protocol without IV contrast.  COMPARISON:  None.  FINDINGS: There is mild right perinephric stranding with minimal right hydronephrosis and stranding surrounding the right ureter. In the right pelvis, there is a 1 mm subtle calcific density surrounding by soft tissue, distal right ureteral stone is not excluded. There are no kidney stones bilaterally. There is no left hydronephrosis.  There is mild diffuse fatty infiltration of liver. The liver is otherwise normal on this noncontrast exam. The spleen, pancreas, gallbladder, adrenal glands are normal. There is a small hiatal hernia. The aorta is normal. There is no abdominal lymphadenopathy. There is no small bowel obstruction or diverticulitis. The appendix is normal.  Partial fluid-filled bladder is normal. The uterus is normal. Pelvic phleboliths are noted. Minimal dependent atelectasis of the right lung base. Degenerative joint changes of the mid to lower lumbar spine are noted.  IMPRESSION: Mild right perinephric stranding with minimal right hydronephrosis and stranding surrounding the right ureter. In the right pelvis, there is a 1 mm calcific density surrounded by soft tissue, distal right ureteral stone is not excluded.   Electronically Signed   By: Sherian ReinWei-Chen  Lin M.D.   On: 08/09/2014 10:35     EKG Interpretation None      MDM   Final diagnoses:  Flank pain  Kidney stone   48 yo with sudden onset right flank pain. She has  hematuria on UA. She has been diagnosed with a Kidney Stone via CT. There is no evidence of significant hydronephrosis, serum creatine WNL, vitals sign stable and the pt does not have irratractable vomiting.  She does have an elevated wbc but I doubt this is related to an acute infectious etiology as she is afebrile and well-appearing. Her pain was managed in the ED.  Pt will be dc home with pain medications & has been advised to follow up with PCP or urology referral.     Filed Vitals:   08/09/14 0916 08/09/14 1133  BP: 119/80 131/83  Pulse: 114 100  Temp: 98.7 F (37.1 C) 98.8 F (37.1 C)  TempSrc: Oral Oral  Resp: 16 16  Weight: 183 lb 4.8 oz (83.144 kg)   SpO2: 100% 100%   Meds given in ED:  Medications  sodium chloride 0.9 % bolus 1,000 mL (0 mLs Intravenous Stopped 08/09/14 1109)  ondansetron (ZOFRAN)  injection 4 mg (4 mg Intravenous Given 08/09/14 1014)  morphine 4 MG/ML injection 4 mg (4 mg Intravenous Given 08/09/14 1014)    Discharge Medication List as of 08/09/2014 11:41 AM    START taking these medications   Details  HYDROcodone-acetaminophen (NORCO/VICODIN) 5-325 MG per tablet Take 1-2 tablets by mouth every 4 (four) hours as needed for moderate pain or severe pain., Starting 08/09/2014, Until Discontinued, Print    ondansetron (ZOFRAN) 4 MG tablet Take 1 tablet (4 mg total) by mouth every 6 (six) hours., Starting 08/09/2014, Until Discontinued, Print            Harle BattiestElizabeth Sharene Krikorian, NP 08/09/14 1301  Geoffery Lyonsouglas Delo, MD 08/09/14 1320

## 2014-08-09 NOTE — ED Notes (Signed)
Pt having right sided abdominal pain since 3 am.  Pt having some N/V.  No diarrhea.  Some diaphoresis.  Pt states pain was so bad, she was unable to walk erect.

## 2014-08-11 ENCOUNTER — Telehealth: Payer: Self-pay | Admitting: *Deleted

## 2014-08-11 DIAGNOSIS — M5416 Radiculopathy, lumbar region: Secondary | ICD-10-CM

## 2014-08-11 MED ORDER — PREDNISONE 20 MG PO TABS: ORAL_TABLET | ORAL | Status: AC

## 2014-08-11 MED ORDER — CYCLOBENZAPRINE HCL 10 MG PO TABS: ORAL_TABLET | ORAL | Status: DC

## 2014-08-11 NOTE — Telephone Encounter (Signed)
Pt states he sciatica is bothering her and he she requests a rx for flexeril and prednisone. Muscle relaxer sent to [facility]. Ok for prednisone?

## 2014-08-11 NOTE — Telephone Encounter (Signed)
pred has been sent

## 2014-08-30 ENCOUNTER — Ambulatory Visit (INDEPENDENT_AMBULATORY_CARE_PROVIDER_SITE_OTHER): Payer: 59 | Admitting: Family Medicine

## 2014-08-30 ENCOUNTER — Encounter: Payer: Self-pay | Admitting: Family Medicine

## 2014-08-30 VITALS — BP 149/83 | HR 101 | Wt 188.0 lb

## 2014-08-30 DIAGNOSIS — M5126 Other intervertebral disc displacement, lumbar region: Secondary | ICD-10-CM

## 2014-08-30 DIAGNOSIS — M5417 Radiculopathy, lumbosacral region: Secondary | ICD-10-CM

## 2014-08-30 DIAGNOSIS — M5416 Radiculopathy, lumbar region: Secondary | ICD-10-CM

## 2014-08-30 MED ORDER — CYCLOBENZAPRINE HCL 10 MG PO TABS: ORAL_TABLET | ORAL | Status: DC

## 2014-08-30 MED ORDER — PREDNISONE 20 MG PO TABS: ORAL_TABLET | ORAL | Status: AC

## 2014-08-30 NOTE — Progress Notes (Signed)
CC: Angela Benton is a 48 y.o. female is here for Sciatica   Subjective: HPI:   pain in the right lower back that radiates into the right buttock and then all the way down the back of the right leg into the heel. It is similar to pain that she attributes to " sciatica"  However it also has a numb sensation in the right foot that is new to her. Symptoms have been present for the past 3 or 4 days. Slightly improved with Naprosyn and Flexeril however only for a few hours  And still pain is only alleviated to a moderate degree. Overall she states the pain is moderate in severity.  Lying on her back completely alleviates the pain while she is lying on her back but then returns after she changes positions.Other than above nothing particularly makes better or worse. She denies any new injury or overexertion. Denies midline back pain subtle paresthesia nor any weakness in the lower extremities.   Review Of Systems Outlined In HPI  Past Medical History  Diagnosis Date  . Herniated lumbar intervertebral disc     L4 and L5  . Insomnia   . Sickle cell trait     Past Surgical History  Procedure Laterality Date  . Cesarean section     Family History  Problem Relation Age of Onset  . Diabetes Mother     History   Social History  . Marital Status: Single    Spouse Name: N/A    Number of Children: N/A  . Years of Education: N/A   Occupational History  . Not on file.   Social History Main Topics  . Smoking status: Never Smoker   . Smokeless tobacco: Not on file  . Alcohol Use: No  . Drug Use: No  . Sexual Activity: Not Currently   Other Topics Concern  . Not on file   Social History Narrative     Objective: BP 149/83 mmHg  Pulse 101  Wt 188 lb (85.276 kg)  LMP 07/26/2014  General: Alert and Oriented, No Acute Distress HEENT: Pupils equal, round, reactive to light. Conjunctivae clear.   Moist mucous membraes Lungs:  Clear comfortable work of breathing Cardiac: Regular rate and  rhythm.  back: No midline spinous process tenderness in the lumbar region. She has full range of motion and strength in both lower extremities.  L4 and S1 DTR 2 over 4 bilaterally and symmetric Extremities: No peripheral edema.  Strong peripheral pulses.  Mental Status: No depression, anxiety, nor agitation. Skin: Warm and dry.  Assessment & Plan: Angela Benton was seen today for sciatica.  Diagnoses and associated orders for this visit:  Herniated lumbar intervertebral disc - predniSONE (DELTASONE) 20 MG tablet; Three tabs daily days 1-3, two tabs daily days 4-6, one tab daily days 7-9, half tab daily days 10-13.  Right lumbar radiculitis - cyclobenzaprine (FLEXERIL) 10 MG tablet; One half tab PO qHS, then increase gradually to one tab TID. Only use as needed     right lumbar radiculitis: Continue as needed Flexeril adding prednisone taper.  Return if symptoms worsen or fail to improve.

## 2014-09-06 ENCOUNTER — Ambulatory Visit: Payer: 59 | Admitting: Family Medicine

## 2014-09-21 ENCOUNTER — Encounter: Payer: Self-pay | Admitting: Family Medicine

## 2014-09-21 ENCOUNTER — Ambulatory Visit (INDEPENDENT_AMBULATORY_CARE_PROVIDER_SITE_OTHER): Payer: 59 | Admitting: Family Medicine

## 2014-09-21 VITALS — BP 147/96 | HR 107 | Wt 187.0 lb

## 2014-09-21 DIAGNOSIS — I10 Essential (primary) hypertension: Secondary | ICD-10-CM

## 2014-09-21 DIAGNOSIS — G4700 Insomnia, unspecified: Secondary | ICD-10-CM

## 2014-09-21 MED ORDER — ZOLPIDEM TARTRATE 10 MG PO TABS: 10.0000 mg | ORAL_TABLET | Freq: Every evening | ORAL | Status: DC | PRN

## 2014-09-21 NOTE — Progress Notes (Signed)
CC: Angela Benton is a 49 y.o. female is here for f/u ambien   Subjective: HPI:  Follow-up insomnia: Currently taking Ambien 10 mg on a nightly basis. She has no difficulty with falling asleep or staying asleep provided she takes is on nightly basis. She denies known side effects or intolerances. There has been no episodes of sleepwalking. She does not feel sluggish or groggy the following morning. Other than Ambien nothing seems to make insomnia better.  Follow-up elevated blood pressure: At her last visit her blood pressure was elevated however it was felt that this was influenced due to her pain. She is currently pain-free. She admits room for improvement with her diet and currently has no formal exercise routine. No outside blood pressures to report. No chest pain shortness of breath orthopnea nor peripheral edema   Review Of Systems Outlined In HPI  Past Medical History  Diagnosis Date  . Herniated lumbar intervertebral disc     L4 and L5  . Insomnia   . Sickle cell trait     Past Surgical History  Procedure Laterality Date  . Cesarean section     Family History  Problem Relation Age of Onset  . Diabetes Mother     History   Social History  . Marital Status: Single    Spouse Name: N/A    Number of Children: N/A  . Years of Education: N/A   Occupational History  . Not on file.   Social History Main Topics  . Smoking status: Never Smoker   . Smokeless tobacco: Not on file  . Alcohol Use: No  . Drug Use: No  . Sexual Activity: Not Currently   Other Topics Concern  . Not on file   Social History Narrative     Objective: BP 147/96 mmHg  Pulse 107  Wt 187 lb (84.823 kg)  General: Alert and Oriented, No Acute Distress HEENT: Pupils equal, round, reactive to light. Conjunctivae clear.  Moist mucous membranes Lungs: Clear to auscultation bilaterally, no wheezing/ronchi/rales.  Comfortable work of breathing. Good air movement. Cardiac: Regular rate and rhythm.  Normal S1/S2.  No murmurs, rubs, nor gallops.   Abdomen: Mild obesity Extremities: No peripheral edema.  Strong peripheral pulses.  Mental Status: No depression, anxiety, nor agitation. Skin: Warm and dry.  Assessment & Plan: Angela Benton was seen today for f/u ambien.  Diagnoses and associated orders for this visit:  Insomnia - zolpidem (AMBIEN) 10 MG tablet; Take 1 tablet (10 mg total) by mouth at bedtime as needed for sleep.  Essential hypertension     insomnia: Controlled continue Ambien on an as-needed basis essential hypertension: Uncontrolled chronic condition, discussed new diagnosis and benefits of reducing sodium intake to a goal of less than 2000 mg on a daily basis. Whenever DASH diet. Also discussed exercise routine such as walking on a treadmill for 30 minutes 3-4 times a week all of which should help lower blood pressure to the point where she does not need antihypertensives. We'll give her 3 months of attempting to change lifestyle before initiating antihypertensives.   Return in about 3 months (around 12/21/2014) for BP and Sleep.

## 2014-09-21 NOTE — Patient Instructions (Addendum)
DASH Eating Plan °DASH stands for "Dietary Approaches to Stop Hypertension." The DASH eating plan is a healthy eating plan that has been shown to reduce high blood pressure (hypertension). Additional health benefits may include reducing the risk of type 2 diabetes mellitus, heart disease, and stroke. The DASH eating plan may also help with weight loss. °WHAT DO I NEED TO KNOW ABOUT THE DASH EATING PLAN? °For the DASH eating plan, you will follow these general guidelines: °· Choose foods with a percent daily value for sodium of less than 5% (as listed on the food label). °· Use salt-free seasonings or herbs instead of table salt or sea salt. °· Check with your health care provider or pharmacist before using salt substitutes. °· Eat lower-sodium products, often labeled as "lower sodium" or "no salt added." °· Eat fresh foods. °· Eat more vegetables, fruits, and low-fat dairy products. °· Choose whole grains. Look for the word "whole" as the first word in the ingredient list. °· Choose fish and skinless chicken or turkey more often than red meat. Limit fish, poultry, and meat to 6 oz (170 g) each day. °· Limit sweets, desserts, sugars, and sugary drinks. °· Choose heart-healthy fats. °· Limit cheese to 1 oz (28 g) per day. °· Eat more home-cooked food and less restaurant, buffet, and fast food. °· Limit fried foods. °· Cook foods using methods other than frying. °· Limit canned vegetables. If you do use them, rinse them well to decrease the sodium. °· When eating at a restaurant, ask that your food be prepared with less salt, or no salt if possible. °WHAT FOODS CAN I EAT? °Seek help from a dietitian for individual calorie needs. °Grains °Whole grain or whole wheat bread. Brown rice. Whole grain or whole wheat pasta. Quinoa, bulgur, and whole grain cereals. Low-sodium cereals. Corn or whole wheat flour tortillas. Whole grain cornbread. Whole grain crackers. Low-sodium crackers. °Vegetables °Fresh or frozen vegetables  (raw, steamed, roasted, or grilled). Low-sodium or reduced-sodium tomato and vegetable juices. Low-sodium or reduced-sodium tomato sauce and paste. Low-sodium or reduced-sodium canned vegetables.  °Fruits °All fresh, canned (in natural juice), or frozen fruits. °Meat and Other Protein Products °Ground beef (85% or leaner), grass-fed beef, or beef trimmed of fat. Skinless chicken or turkey. Ground chicken or turkey. Pork trimmed of fat. All fish and seafood. Eggs. Dried beans, peas, or lentils. Unsalted nuts and seeds. Unsalted canned beans. °Dairy °Low-fat dairy products, such as skim or 1% milk, 2% or reduced-fat cheeses, low-fat ricotta or cottage cheese, or plain low-fat yogurt. Low-sodium or reduced-sodium cheeses. °Fats and Oils °Tub margarines without trans fats. Light or reduced-fat mayonnaise and salad dressings (reduced sodium). Avocado. Safflower, olive, or canola oils. Natural peanut or almond butter. °Other °Unsalted popcorn and pretzels. °The items listed above may not be a complete list of recommended foods or beverages. Contact your dietitian for more options. °WHAT FOODS ARE NOT RECOMMENDED? °Grains °White bread. White pasta. White rice. Refined cornbread. Bagels and croissants. Crackers that contain trans fat. °Vegetables °Creamed or fried vegetables. Vegetables in a cheese sauce. Regular canned vegetables. Regular canned tomato sauce and paste. Regular tomato and vegetable juices. °Fruits °Dried fruits. Canned fruit in light or heavy syrup. Fruit juice. °Meat and Other Protein Products °Fatty cuts of meat. Ribs, chicken wings, bacon, sausage, bologna, salami, chitterlings, fatback, hot dogs, bratwurst, and packaged luncheon meats. Salted nuts and seeds. Canned beans with salt. °Dairy °Whole or 2% milk, cream, half-and-half, and cream cheese. Whole-fat or sweetened yogurt. Full-fat   cheeses or blue cheese. Nondairy creamers and whipped toppings. Processed cheese, cheese spreads, or cheese  curds. °Condiments °Onion and garlic salt, seasoned salt, table salt, and sea salt. Canned and packaged gravies. Worcestershire sauce. Tartar sauce. Barbecue sauce. Teriyaki sauce. Soy sauce, including reduced sodium. Steak sauce. Fish sauce. Oyster sauce. Cocktail sauce. Horseradish. Ketchup and mustard. Meat flavorings and tenderizers. Bouillon cubes. Hot sauce. Tabasco sauce. Marinades. Taco seasonings. Relishes. °Fats and Oils °Butter, stick margarine, lard, shortening, ghee, and bacon fat. Coconut, palm kernel, or palm oils. Regular salad dressings. °Other °Pickles and olives. Salted popcorn and pretzels. °The items listed above may not be a complete list of foods and beverages to avoid. Contact your dietitian for more information. °WHERE CAN I FIND MORE INFORMATION? °National [employer], [employer], [employer] Institute: www.nhlbi.nih.gov/health/health-topics/topics/dash/ °Document Released: 08/09/2011 Document Revised: 01/04/2014 Document Reviewed: 06/24/2013 °ExitCare® Patient Information ©2015 [employer]. This information is not intended to replace advice given to you by your health care provider. Make sure you discuss any questions you have with your health care provider. ° °

## 2014-09-22 ENCOUNTER — Ambulatory Visit: Payer: 59 | Admitting: Family Medicine

## 2014-10-20 ENCOUNTER — Other Ambulatory Visit (INDEPENDENT_AMBULATORY_CARE_PROVIDER_SITE_OTHER): Payer: 59

## 2014-10-20 DIAGNOSIS — N898 Other specified noninflammatory disorders of vagina: Secondary | ICD-10-CM

## 2014-10-21 ENCOUNTER — Telehealth: Payer: Self-pay | Admitting: *Deleted

## 2014-10-21 LAB — WET PREP BY MOLECULAR PROBE
CANDIDA SPECIES: NEGATIVE
GARDNERELLA VAGINALIS: POSITIVE — AB
TRICHOMONAS VAG: NEGATIVE

## 2014-10-21 MED ORDER — METRONIDAZOLE 500 MG PO TABS: 500.0000 mg | ORAL_TABLET | Freq: Two times a day (BID) | ORAL | Status: AC

## 2014-10-21 NOTE — Telephone Encounter (Signed)
Called pt to adv positive BV and will send in Flagyl. Pt expressed understanding.  

## 2014-11-18 ENCOUNTER — Other Ambulatory Visit: Payer: Self-pay | Admitting: *Deleted

## 2014-11-18 DIAGNOSIS — G4700 Insomnia, unspecified: Secondary | ICD-10-CM

## 2014-11-19 ENCOUNTER — Ambulatory Visit: Payer: 59 | Admitting: Family Medicine

## 2014-12-21 ENCOUNTER — Ambulatory Visit: Payer: 59 | Admitting: Family Medicine

## 2014-12-23 ENCOUNTER — Encounter: Payer: Self-pay | Admitting: Family Medicine

## 2014-12-23 ENCOUNTER — Ambulatory Visit (INDEPENDENT_AMBULATORY_CARE_PROVIDER_SITE_OTHER): Payer: 59 | Admitting: Family Medicine

## 2014-12-23 VITALS — BP 134/87 | HR 97 | Wt 189.0 lb

## 2014-12-23 DIAGNOSIS — G4700 Insomnia, unspecified: Secondary | ICD-10-CM

## 2014-12-23 DIAGNOSIS — R030 Elevated blood-pressure reading, without diagnosis of hypertension: Secondary | ICD-10-CM | POA: Diagnosis not present

## 2014-12-23 DIAGNOSIS — Z1239 Encounter for other screening for malignant neoplasm of breast: Secondary | ICD-10-CM

## 2014-12-23 DIAGNOSIS — IMO0001 Reserved for inherently not codable concepts without codable children: Secondary | ICD-10-CM

## 2014-12-23 DIAGNOSIS — E663 Overweight: Secondary | ICD-10-CM | POA: Diagnosis not present

## 2014-12-23 MED ORDER — PHENTERMINE HCL 30 MG PO CAPS: 30.0000 mg | ORAL_CAPSULE | ORAL | Status: DC

## 2014-12-23 MED ORDER — ZOLPIDEM TARTRATE 10 MG PO TABS
10.0000 mg | ORAL_TABLET | Freq: Every evening | ORAL | Status: DC | PRN
Start: 2014-12-23 — End: 2015-03-21

## 2014-12-23 NOTE — Progress Notes (Signed)
CC: Angela ShoalsKaren D Benton is a 49 y.o. female is here for Hypertension and Insomnia   Subjective: HPI:  Follow-up elevated blood pressure: Since I saw her last she's been successful at reducing sodium in her diet. She tells me they're still a great room for improvement with this but she believes it she's been successful overall. No outside blood pressures to report. Has been unable to start a formal exercise routine. No chest pain shortness of breath orthopnea nor peripheral edema  Follow-up insomnia: She continues to take Ambien on a nightly basis without any known side effects. She denies sleepwalking or drowsiness in the morning. She denies difficulty falling asleep or staying asleep provided she takes this on a daily basis.  She wonders if she can restart phentermine that she's had success with in the past with weight loss. She's having difficulty focusing on portion control and had no side effects with the medication in the past.  Review Of Systems Outlined In HPI  Past Medical History  Diagnosis Date  . Herniated lumbar intervertebral disc     L4 and L5  . Insomnia   . Sickle cell trait     Past Surgical History  Procedure Laterality Date  . Cesarean section     Family History  Problem Relation Age of Onset  . Diabetes Mother     History   Social History  . Marital Status: Single    Spouse Name: N/A  . Number of Children: N/A  . Years of Education: N/A   Occupational History  . Not on file.   Social History Main Topics  . Smoking status: Never Smoker   . Smokeless tobacco: Not on file  . Alcohol Use: No  . Drug Use: No  . Sexual Activity: Not Currently   Other Topics Concern  . Not on file   Social History Narrative     Objective: BP 134/87 mmHg  Pulse 97  Wt 189 lb (85.73 kg)  Vital signs reviewed. General: Alert and Oriented, No Acute Distress HEENT: Pupils equal, round, reactive to light. Conjunctivae clear.  External ears unremarkable.  Moist mucous  membranes. Lungs: Clear and comfortable work of breathing, speaking in full sentences without accessory muscle use. Cardiac: Regular rate and rhythm.  Neuro: CN II-XII grossly intact, gait normal. Extremities: No peripheral edema.  Strong peripheral pulses.  Mental Status: No depression, anxiety, nor agitation. Logical though process. Skin: Warm and dry.  Assessment & Plan: Angela BraunKaren was seen today for hypertension and insomnia.  Diagnoses and all orders for this visit:  Screening for breast cancer Orders: -     MM DIGITAL SCREENING BILATERAL; Future  Insomnia Orders: -     zolpidem (AMBIEN) 10 MG tablet; Take 1 tablet (10 mg total) by mouth at bedtime as needed for sleep.  Overweight  Elevated blood pressure  Other orders -     phentermine 30 MG capsule; Take 1 capsule (30 mg total) by mouth every morning.   I brought her attention that she's never had a mammogram, recommended that she have this in the near future [facility]. Insomnia: Control continue as needed Ambien Overweight: Restarting phentermine will need a follow-up for nurse visit in one month for blood pressure and weight check. Elevated blood pressure: Improved with sodium restriction, advised that beginning an exercise plan will help both with reducing blood pressure and reducing weight.  Return in about 3 months (around 03/24/2015).

## 2015-02-14 ENCOUNTER — Telehealth: Payer: Self-pay | Admitting: Family Medicine

## 2015-02-14 ENCOUNTER — Other Ambulatory Visit: Payer: Self-pay | Admitting: *Deleted

## 2015-02-14 DIAGNOSIS — M5416 Radiculopathy, lumbar region: Secondary | ICD-10-CM

## 2015-02-14 MED ORDER — CYCLOBENZAPRINE HCL 10 MG PO TABS: ORAL_TABLET | ORAL | Status: DC

## 2015-02-14 NOTE — Telephone Encounter (Signed)
Refill request on Flexeril  Request

## 2015-03-21 ENCOUNTER — Telehealth: Payer: Self-pay

## 2015-03-21 DIAGNOSIS — G4700 Insomnia, unspecified: Secondary | ICD-10-CM

## 2015-03-21 MED ORDER — ZOLPIDEM TARTRATE 10 MG PO TABS: 10.0000 mg | ORAL_TABLET | Freq: Every evening | ORAL | Status: DC | PRN

## 2015-03-21 NOTE — Telephone Encounter (Signed)
Angela BraunKaren has scheduled a follow up appointment. She would like a refill on Ambien. Ambien prescription placed in Dr MedtronicHommel's box.

## 2015-03-21 NOTE — Telephone Encounter (Signed)
Angela Benton, Rx placed in in-box ready for pickup/faxing.  

## 2015-03-21 NOTE — Telephone Encounter (Signed)
This was faxed by angela today

## 2015-03-28 ENCOUNTER — Ambulatory Visit (INDEPENDENT_AMBULATORY_CARE_PROVIDER_SITE_OTHER): Payer: 59 | Admitting: Family Medicine

## 2015-03-28 ENCOUNTER — Encounter: Payer: Self-pay | Admitting: Family Medicine

## 2015-03-28 VITALS — BP 127/83 | HR 92 | Wt 187.0 lb

## 2015-03-28 DIAGNOSIS — IMO0001 Reserved for inherently not codable concepts without codable children: Secondary | ICD-10-CM

## 2015-03-28 DIAGNOSIS — E663 Overweight: Secondary | ICD-10-CM | POA: Diagnosis not present

## 2015-03-28 DIAGNOSIS — R030 Elevated blood-pressure reading, without diagnosis of hypertension: Secondary | ICD-10-CM

## 2015-03-28 DIAGNOSIS — G4700 Insomnia, unspecified: Secondary | ICD-10-CM

## 2015-03-28 MED ORDER — PHENTERMINE HCL 30 MG PO CAPS: 30.0000 mg | ORAL_CAPSULE | ORAL | Status: DC

## 2015-03-28 MED ORDER — ZOLPIDEM TARTRATE 10 MG PO TABS: 10.0000 mg | ORAL_TABLET | Freq: Every evening | ORAL | Status: DC | PRN

## 2015-03-28 NOTE — Progress Notes (Signed)
CC: Angela Benton is a 49 y.o. female is here for 3 month f/u [place] and hypertension   Subjective: HPI:  Follow-up insomnia: Continues taking Ambien most nights of the week. She states that this continues to help with her difficulty falling asleep and staying asleep. She denies any symptoms provided she takes this on a daily basis. She denies any side effects. Denies nonrestorative sleep, daytime sleepiness, nor night walking.  Follow-up elevated blood pressure: No outside blood pressures to report. She is trying to cut back on sodium and trying to eat more fruits and vegetables. Instead of a regular breakfast she is consuming fruit and vegetable smoothie every morning.  Follow-up overweight: She is taking phentermine less than most days of the week. On the days that she takes that she has no desire to eat lunch but still has a small snack around lunchtime. She denies any known side effects from phentermine. She is only taking it on days that she feels like she might not be able to control her appetite.  Review Of Systems Outlined In HPI  Past Medical History  Diagnosis Date  . Herniated lumbar intervertebral disc     L4 and L5  . Insomnia   . Sickle cell trait     Past Surgical History  Procedure Laterality Date  . Cesarean section     Family History  Problem Relation Age of Onset  . Diabetes Mother     History   Social History  . Marital Status: Single    Spouse Name: N/A  . Number of Children: N/A  . Years of Education: N/A   Occupational History  . Not on file.   Social History Main Topics  . Smoking status: Never Smoker   . Smokeless tobacco: Not on file  . Alcohol Use: No  . Drug Use: No  . Sexual Activity: Not Currently   Other Topics Concern  . Not on file   Social History Narrative     Objective: BP 127/83 mmHg  Pulse 92  Wt 187 lb (84.823 kg)  General: Alert and Oriented, No Acute Distress HEENT: Pupils equal, round, reactive to light.  Conjunctivae clear.  Moist mucous membranes Lungs: Clear to auscultation bilaterally, no wheezing/ronchi/rales.  Comfortable work of breathing. Good air movement. Cardiac: Regular rate and rhythm. Normal S1/S2.  No murmurs, rubs, nor gallops.   Extremities: No peripheral edema.  Strong peripheral pulses.  Mental Status: No depression, anxiety, nor agitation. Skin: Warm and dry.  Assessment & Plan: Angela Benton was seen today for 3 month f/u ambien and hypertension.  Diagnoses and all orders for this visit:  Insomnia Orders: -     zolpidem (AMBIEN) 10 MG tablet; Take 1 tablet (10 mg total) by mouth at bedtime as needed for sleep.  Elevated blood pressure  Overweight Orders: -     phentermine 30 MG capsule; Take 1 capsule (30 mg total) by mouth every morning.   Insomnia: Controlled continue Ambien Elevated blood pressure: Improved after recheck, controlled, continue focusing on diet and exercise interventions. Overweight: Improving with phentermine: Encouraged to add some whey protein to breakfast every morning since it sounds like breakfast mostly consists of carbohydrates.  Return in about 3 months (around 06/28/2015).

## 2015-06-27 ENCOUNTER — Encounter: Payer: Self-pay | Admitting: Obstetrics & Gynecology

## 2015-06-27 ENCOUNTER — Ambulatory Visit (INDEPENDENT_AMBULATORY_CARE_PROVIDER_SITE_OTHER): Payer: 59 | Admitting: Obstetrics & Gynecology

## 2015-06-27 ENCOUNTER — Ambulatory Visit (HOSPITAL_BASED_OUTPATIENT_CLINIC_OR_DEPARTMENT_OTHER)
Admission: RE | Admit: 2015-06-27 | Discharge: 2015-06-27 | Disposition: A | Discharge status: Home / Self Care | Payer: 59 | Source: Ambulatory Visit | Attending: Obstetrics & Gynecology | Admitting: Obstetrics & Gynecology

## 2015-06-27 ENCOUNTER — Other Ambulatory Visit: Payer: 59

## 2015-06-27 VITALS — BP 127/90 | HR 85 | Ht 67.0 in | Wt 182.0 lb

## 2015-06-27 DIAGNOSIS — N951 Menopausal and female climacteric states: Secondary | ICD-10-CM | POA: Diagnosis not present

## 2015-06-27 DIAGNOSIS — Z1231 Encounter for screening mammogram for malignant neoplasm of breast: Secondary | ICD-10-CM | POA: Diagnosis not present

## 2015-06-27 DIAGNOSIS — Z0000 Encounter for general adult medical examination without abnormal findings: Secondary | ICD-10-CM

## 2015-06-27 MED ORDER — FLUOXETINE HCL 20 MG PO CAPS: 20.0000 mg | ORAL_CAPSULE | Freq: Every day | ORAL | Status: DC

## 2015-06-27 NOTE — Addendum Note (Signed)
Addended by: Allie BossierVE, Corayma Cashatt C on: 06/27/2015 09:22 AM   Modules accepted: Orders

## 2015-06-27 NOTE — Addendum Note (Signed)
Addended by: Anell BarrHOWARD, Yulisa Chirico L on: 06/27/2015 04:12 PM   Modules accepted: Orders

## 2015-06-27 NOTE — Progress Notes (Signed)
   Subjective:    Patient ID: Angela Benton, female    DOB: [birth year], 49 y.o.   MRN: [id number]  HPI  49 yo S AA P3 is here with perimenopausal symptoms for a year, hot flashes, night sweats. She also has mood swings for the same time frame, mostly a week prior to her monthly period (light, 3 days after an ablation 10 years ago). She tried Zoloft in the distant past with no success.  Review of Systems     Objective:   Physical Exam [id number] Breathing, conversing, and ambulating normally Abd- benign       Assessment & Plan:  Preventative- she has had her flu vaccine, will get a mammogram now and a pap in a year Perimenopause symptoms- check FSH and TSH Trial of prozac 20 mg every morning (may help with weight loss) RTC 4 weeks

## 2015-06-28 ENCOUNTER — Other Ambulatory Visit: Payer: 59

## 2015-06-28 ENCOUNTER — Ambulatory Visit (HOSPITAL_BASED_OUTPATIENT_CLINIC_OR_DEPARTMENT_OTHER): Payer: 59

## 2015-06-28 LAB — FOLLICLE STIMULATING HORMONE: FSH: 14.8 m[IU]/mL

## 2015-06-28 LAB — TSH: TSH: 0.863 u[IU]/mL (ref 0.350–4.500)

## 2015-07-12 ENCOUNTER — Ambulatory Visit (INDEPENDENT_AMBULATORY_CARE_PROVIDER_SITE_OTHER): Payer: 59 | Admitting: Family Medicine

## 2015-07-12 ENCOUNTER — Encounter: Payer: Self-pay | Admitting: Family Medicine

## 2015-07-12 ENCOUNTER — Telehealth: Payer: Self-pay

## 2015-07-12 VITALS — BP 137/86 | HR 82 | Wt 186.0 lb

## 2015-07-12 DIAGNOSIS — M5416 Radiculopathy, lumbar region: Secondary | ICD-10-CM

## 2015-07-12 DIAGNOSIS — G4700 Insomnia, unspecified: Secondary | ICD-10-CM | POA: Diagnosis not present

## 2015-07-12 DIAGNOSIS — M5417 Radiculopathy, lumbosacral region: Secondary | ICD-10-CM | POA: Diagnosis not present

## 2015-07-12 DIAGNOSIS — F39 Unspecified mood [affective] disorder: Secondary | ICD-10-CM | POA: Diagnosis not present

## 2015-07-12 DIAGNOSIS — R4586 Emotional lability: Secondary | ICD-10-CM

## 2015-07-12 MED ORDER — CYCLOBENZAPRINE HCL 10 MG PO TABS: ORAL_TABLET | ORAL | Status: DC

## 2015-07-12 MED ORDER — ZOLPIDEM TARTRATE 10 MG PO TABS: 10.0000 mg | ORAL_TABLET | Freq: Every evening | ORAL | Status: DC | PRN

## 2015-07-12 NOTE — Progress Notes (Signed)
CC: Angela Benton is a 49 y.o. female is here for Insomnia   Subjective: HPI:  Follow-up insomnia: Taking Ambien on a nightly basis without any known side effects. Provided she takes this she has no difficulty falling asleep or staying asleep.she denies any tiredness in the morning.  She'll be due for refills of cyclobenzaprine that she takes for occasional radicular symptoms in the low back. Symptoms have not gone better or worse since I saw her last and are not interfering with quality of life.  Her major complaint today is mood swings, hot flashes and irritability that has been going on for matter of months at least 3 months. Symptoms are present on a daily basis. They've not been getting better or worse since onset. It came on gradually. She was provided Prozac by her gynecologist and this caused a tremor in her hands after taking it for 1 week so she stopped it. It was not particularly helping with any of the above symptoms. She's also tried black cohosh but did not get any benefit. She denies depression or any other mental disturbance. Denies fevers, chills, nor unintentional weight loss or gain   Review Of Systems Outlined In HPI  Past Medical History  Diagnosis Date  . Herniated lumbar intervertebral disc     L4 and L5  . Insomnia   . Sickle cell trait [facility](HCC)     Past Surgical History  Procedure Laterality Date  . Cesarean section     Family History  Problem Relation Age of Onset  . Diabetes Mother     Social History   Social History  . Marital Status: Single    Spouse Name: N/A  . Number of Children: N/A  . Years of Education: N/A   Occupational History  . Not on file.   Social History Main Topics  . Smoking status: Never Smoker   . Smokeless tobacco: Not on file  . Alcohol Use: No  . Drug Use: No  . Sexual Activity: Not Currently   Other Topics Concern  . Not on file   Social History Narrative     Objective: BP 137/86 mmHg  Pulse 82  Wt 186 lb (84.369  kg)  LMP 06/25/2015  General: Alert and Oriented, No Acute Distress HEENT: Pupils equal, round, reactive to light. Conjunctivae clear.  Moist mucous membranes Lungs: Clear to auscultation bilaterally, no wheezing/ronchi/rales.  Comfortable work of breathing. Good air movement. Cardiac: Regular rate and rhythm. Normal S1/S2.  No murmurs, rubs, nor gallops.   Extremities: No peripheral edema.  Strong peripheral pulses.  Mental Status: No depression, anxiety, nor agitation. Skin: Warm and dry.  Assessment & Plan: Angela Benton was seen today for insomnia.  Diagnoses and all orders for this visit:  Insomnia -     zolpidem (AMBIEN) 10 MG tablet; Take 1 tablet (10 mg total) by mouth at bedtime as needed for sleep.  Mood swings (HCC)  Right lumbar radiculitis -     cyclobenzaprine (FLEXERIL) 10 MG tablet; One half tab PO qHS, then increase gradually to one tab TID. Only use as needed   Insomnia: Controlled with Ambien  right lumbar radiculitis:Controlled with as needed use of Flexeril Mood swings: Most likely secondary to perimenopausal period of her life. Discussed the benefit of low-dose Lexapro and this time of her life with respect to mood, irritability and hot flashes. She politely declines but she knows the offer is on the table   Return in about 3 months (around 10/12/2015).

## 2015-07-12 NOTE — Telephone Encounter (Signed)
error 

## 2015-07-15 ENCOUNTER — Telehealth: Payer: Self-pay | Admitting: Family Medicine

## 2015-07-15 MED ORDER — ESCITALOPRAM OXALATE 5 MG PO TABS: 5.0000 mg | ORAL_TABLET | Freq: Every day | ORAL | Status: DC

## 2015-07-15 NOTE — Telephone Encounter (Signed)
Medication has been sent.  

## 2015-07-15 NOTE — Telephone Encounter (Signed)
Patient wants you to send the antidepressant that you and she discussed recently.  Send to [facility]. thanks

## 2015-07-18 ENCOUNTER — Ambulatory Visit: Payer: 59 | Admitting: Family Medicine

## 2015-09-15 MED FILL — ZOLPIDEM TARTRATE 10 MG TAB: 10 | 30 days supply | Qty: 30 | Fill #2

## 2015-10-11 ENCOUNTER — Encounter: Payer: Self-pay | Admitting: Family Medicine

## 2015-10-11 ENCOUNTER — Ambulatory Visit (INDEPENDENT_AMBULATORY_CARE_PROVIDER_SITE_OTHER): Payer: 59 | Admitting: Family Medicine

## 2015-10-11 VITALS — BP 137/88 | HR 86 | Wt 183.0 lb

## 2015-10-11 DIAGNOSIS — L905 Scar conditions and fibrosis of skin: Secondary | ICD-10-CM

## 2015-10-11 DIAGNOSIS — G4700 Insomnia, unspecified: Secondary | ICD-10-CM

## 2015-10-11 MED ORDER — ZOLPIDEM TARTRATE 10 MG PO TABS: 10.0000 mg | ORAL_TABLET | Freq: Every evening | ORAL | Status: DC | PRN

## 2015-10-11 NOTE — Progress Notes (Signed)
CC: Angela Benton is a 50 y.o. female is here for Medication Refill and itchy scar   Subjective: HPI:  Over the past 3 months patient has been experiencing irritation she describes as itching at the site of her C-section which occurred 15 years ago. She denies any pain. Improved temporarily for a few hours if she uses alcohol on the surface of her skin. Nothing else seems to make it better or worse. Symptoms are mild in severity but quite annoying. She denies any itching elsewhere or skin changes elsewhere. She denies any redness swelling or warmth the site of her C-section. No fevers or chills.  She is requesting refills on Ambien. She uses this nightly to fall asleep and stay asleep. She denies any known side effects or sleepwalking. She denies any difficulty the next morning waking up   Review Of Systems Outlined In HPI  Past Medical History  Diagnosis Date  . Herniated lumbar intervertebral disc     L4 and L5  . Insomnia   . Sickle cell trait [facility])     Past Surgical History  Procedure Laterality Date  . Cesarean section     Family History  Problem Relation Age of Onset  . Diabetes Mother     Social History   Social History  . Marital Status: Single    Spouse Name: N/A  . Number of Children: N/A  . Years of Education: N/A   Occupational History  . Not on file.   Social History Main Topics  . Smoking status: Never Smoker   . Smokeless tobacco: Not on file  . Alcohol Use: No  . Drug Use: No  . Sexual Activity: Not Currently   Other Topics Concern  . Not on file   Social History Narrative     Objective: BP 137/88 mmHg  Pulse 86  Wt 183 lb (83.008 kg)  Vital signs reviewed. General: Alert and Oriented, No Acute Distress HEENT: Pupils equal, round, reactive to light. Conjunctivae clear.  External ears unremarkable.  Moist mucous membranes. Lungs: Clear and comfortable work of breathing, speaking in full sentences without accessory muscle use. Cardiac:  Regular rate and rhythm.  Neuro: CN II-XII grossly intact, gait normal. Extremities: No peripheral edema.  Strong peripheral pulses.  Mental Status: No depression, anxiety, nor agitation. Logical though process. Skin: Warm and dry.  Assessment & Plan: Angela Benton was seen today for medication refill and itchy scar.  Diagnoses and all orders for this visit:  Insomnia -     zolpidem (AMBIEN) 10 MG tablet; Take 1 tablet (10 mg total) by mouth at bedtime as needed for sleep.  Scar irritation   Insomnia: Controlled with Ambien refills provided for 3 months Scar irritation: Proposed that she start using witch hazel daily on the surface of the skin and if this doesn't work transition to Benadryl topical preparation. If this also doesn't work she can also think about getting a compounded formulation of gabapentin or even taking gabapentin or amitriptyline by mouth at bedtime. I prepared Her that if she does want to pursue taking these oral medications there is a high likelihood of side effects such as fatigue.   Return for Physical in 3 months.[name]

## 2015-10-13 MED FILL — ZOLPIDEM TARTRATE 10 MG TAB: 10 | 30 days supply | Qty: 30 | Fill #0

## 2015-10-24 ENCOUNTER — Ambulatory Visit: Payer: 59 | Admitting: Family Medicine

## 2015-11-04 MED FILL — CYCLOBENZAPRINE 10 MG TAB: 10 | 10 days supply | Qty: 30 | Fill #1

## 2015-11-09 MED FILL — ZOLPIDEM TARTRATE 10 MG TAB: 10 | 30 days supply | Qty: 30 | Fill #1

## 2015-12-08 MED FILL — ZOLPIDEM TARTRATE 10 MG TAB: 10 | 30 days supply | Qty: 30 | Fill #2

## 2016-01-02 ENCOUNTER — Encounter: Payer: Self-pay | Admitting: Family Medicine

## 2016-01-02 DIAGNOSIS — Z0000 Encounter for general adult medical examination without abnormal findings: Secondary | ICD-10-CM

## 2016-01-02 DIAGNOSIS — G4700 Insomnia, unspecified: Secondary | ICD-10-CM

## 2016-01-03 MED ORDER — ZOLPIDEM TARTRATE 10 MG PO TABS: 10.0000 mg | ORAL_TABLET | Freq: Every evening | ORAL | Status: DC | PRN

## 2016-01-03 NOTE — Telephone Encounter (Signed)
Please advise about the lab work.  Ambien has been refilled.

## 2016-01-04 ENCOUNTER — Telehealth: Payer: Self-pay | Admitting: Family Medicine

## 2016-01-04 MED FILL — ZOLPIDEM TARTRATE 10 MG TAB: 10 | 30 days supply | Qty: 30 | Fill #0

## 2016-01-04 NOTE — Telephone Encounter (Signed)
[facility] and its still not [facility] at [facility] and she needs this today

## 2016-01-04 NOTE — Telephone Encounter (Signed)
Medication was faxed again.

## 2016-01-09 ENCOUNTER — Ambulatory Visit (INDEPENDENT_AMBULATORY_CARE_PROVIDER_SITE_OTHER): Payer: 59 | Admitting: Family Medicine

## 2016-01-09 ENCOUNTER — Encounter: Payer: Self-pay | Admitting: Family Medicine

## 2016-01-09 ENCOUNTER — Other Ambulatory Visit (INDEPENDENT_AMBULATORY_CARE_PROVIDER_SITE_OTHER): Payer: 59

## 2016-01-09 VITALS — BP 145/85 | HR 91 | Wt 191.0 lb

## 2016-01-09 DIAGNOSIS — Z0000 Encounter for general adult medical examination without abnormal findings: Secondary | ICD-10-CM

## 2016-01-09 LAB — CBC
HCT: 43.1 % (ref 36.0–46.0)
Hemoglobin: 14.7 g/dL (ref 12.0–15.0)
MCHC: 34.2 g/dL (ref 30.0–36.0)
MCV: 78.5 fl (ref 78.0–100.0)
Platelets: 217 10*3/uL (ref 150.0–400.0)
RBC: 5.49 Mil/uL — ABNORMAL HIGH (ref 3.87–5.11)
RDW: 14.7 % (ref 11.5–15.5)
WBC: 8.2 10*3/uL (ref 4.0–10.5)

## 2016-01-09 LAB — LIPID PANEL
CHOLESTEROL: 162 mg/dL (ref 0–200)
HDL: 40.8 mg/dL (ref >39.00)
LDL CALC: 100 mg/dL — AB (ref 0–99)
NonHDL: 120.74
TRIGLYCERIDES: 105 mg/dL (ref 0.0–149.0)
Total CHOL/HDL Ratio: 4
VLDL: 21 mg/dL (ref 0.0–40.0)

## 2016-01-09 LAB — COMPLETE METABOLIC PANEL WITH GFR
ALT: 18 U/L (ref 6–29)
AST: 19 U/L (ref 10–35)
Albumin: 3.9 g/dL (ref 3.6–5.1)
Alkaline Phosphatase: 69 U/L (ref 33–115)
BUN: 7 mg/dL (ref 7–25)
CHLORIDE: 106 mmol/L (ref 98–110)
CO2: 24 mmol/L (ref 20–31)
Calcium: 8.9 mg/dL (ref 8.6–10.2)
Creat: 0.65 mg/dL (ref 0.50–1.10)
GFR, Est Non African American: >89 mL/min (ref >=60)
Glucose, Bld: 86 mg/dL (ref 65–99)
Potassium: 4.3 mmol/L (ref 3.5–5.3)
Sodium: 137 mmol/L (ref 135–146)
Total Bilirubin: 0.8 mg/dL (ref 0.2–1.2)
Total Protein: 7.2 g/dL (ref 6.1–8.1)

## 2016-01-09 NOTE — Progress Notes (Signed)
CC: Angela Benton is a 50 y.o. female is here for Annual Exam   Subjective: HPI:  Colonoscopy: Due in January Papsmear: Due at the end of the year Mammogram: Repeat due October 2017  Influenza Vaccine: UTD Pneumovax: no current indication Td/Tdap: UTD Zoster: (Start 49 yo)  Requesting complete physical exam with no complaints.  Recently started a new exercise routine, personal trainer twice a week, working out solo once a week.   Review of Systems - General ROS: negative for - chills, fever, night sweats, weight gain or weight loss Ophthalmic ROS: negative for - decreased vision Psychological ROS: negative for - anxiety or depression ENT ROS: negative for - hearing change, nasal congestion, tinnitus or allergies Hematological and Lymphatic ROS: negative for - bleeding problems, bruising or swollen lymph nodes Breast ROS: negative Respiratory ROS: no cough, shortness of breath, or wheezing Cardiovascular ROS: no chest pain or dyspnea on exertion Gastrointestinal ROS: no abdominal pain, change in bowel habits, or black or bloody stools Genito-Urinary ROS: negative for - genital discharge, genital ulcers, incontinence or abnormal bleeding from genitals Musculoskeletal ROS: negative for - joint pain or muscle pain Neurological ROS: negative for - headaches or memory loss Dermatological ROS: negative for lumps, mole changes, rash and skin lesion changes  Past Medical History  Diagnosis Date  . Herniated lumbar intervertebral disc     L4 and L5  . Insomnia   . Sickle cell trait [facility])     Past Surgical History  Procedure Laterality Date  . Cesarean section     Family History  Problem Relation Age of Onset  . Diabetes Mother     Social History   Social History  . Marital Status: Single    Spouse Name: N/A  . Number of Children: N/A  . Years of Education: N/A   Occupational History  . Not on file.   Social History Main Topics  . Smoking status: Never Smoker   .  Smokeless tobacco: Not on file  . Alcohol Use: No  . Drug Use: No  . Sexual Activity: Not Currently   Other Topics Concern  . Not on file   Social History Narrative     Objective: BP 145/85 mmHg  Pulse 91  Wt 191 lb (86.637 kg) General: No Acute Distress HEENT: Atraumatic, normocephalic, conjunctivae normal without scleral icterus.  No nasal discharge, hearing grossly intact, TMs with good landmarks bilaterally with no middle ear abnormalities, posterior pharynx clear without oral lesions. Neck: Supple, trachea midline, no cervical nor supraclavicular adenopathy. Pulmonary: Clear to auscultation bilaterally without wheezing, rhonchi, nor rales. Cardiac: Regular rate and rhythm.  No murmurs, rubs, nor gallops. No peripheral edema.  2+ peripheral pulses bilaterally. Abdomen: Bowel sounds normal.  No masses.  Non-tender without rebound.  Negative Murphy's sign. MSK: Grossly intact, no signs of weakness.  Full strength throughout upper and lower extremities.  Full ROM in upper and lower extremities.  No midline spinal tenderness. Neuro: Gait unremarkable, CN II-XII grossly intact.  C5-C6 Reflex 2/4 Bilaterally, L4 Reflex 2/4 Bilaterally.  Cerebellar function intact. Skin: No rashes. Psych: Alert and oriented to person/place/time.  Thought process normal. No anxiety/depression.  Assessment & Plan: Angela Benton was seen today for annual exam.  Diagnoses and all orders for this visit:  Annual physical exam  Healthy lifestyle interventions including but not limited to regular exercise, a healthy low fat diet, moderation of salt intake, the dangers of tobacco/alcohol/recreational drug use, nutrition supplementation, and accident avoidance were discussed with the patient  and a handout was provided for future reference.  Return in about 3 months (around 04/10/2016) for Sleep.

## 2016-01-09 NOTE — Patient Instructions (Signed)
Dr. Susy Placzek's General Advice Following Your Complete Physical Exam  The Benefits of Regular Exercise: Unless you suffer from an uncontrolled cardiovascular condition, studies strongly suggest that regular exercise and physical activity will add to both the quality and length of your life.  The World Health Organization recommends 150 minutes of moderate intensity aerobic activity every week.  This is best split over 3-4 days a week, and can be as simple as a brisk walk for just over 35 minutes "most days of the week".  This type of exercise has been shown to lower LDL-Cholesterol, lower average blood sugars, lower blood pressure, lower cardiovascular disease risk, improve memory, and increase one's overall sense of wellbeing.  The addition of anaerobic (or "strength training") exercises offers additional benefits including but not limited to increased metabolism, prevention of osteoporosis, and improved overall cholesterol levels.  How Can I Strive For A Low-Fat Diet?: Current guidelines recommend that 25-35 percent of your daily energy (food) intake should come from fats.  One might ask how can this be achieved without having to dissect each meal on a daily basis?  Switch to skim or 1% milk instead of whole milk.  Focus on lean meats such as ground turkey, fresh fish, baked chicken, and lean cuts of beef as your source of dietary protein.  Limit saturated fat consumption to less than 10% of your daily caloric intake.  Limit trans fatty acid consumption primarily by limiting synthetic trans fats such as partially hydrogenated oils (Ex: fried fast foods).  Substitute olive or vegetable oil for solid fats where possible.  Moderation of Salt Intake: Provided you don't carry a diagnosis of congestive heart failure nor renal failure, I recommend a daily allowance of no more than 2300 mg of salt (sodium).  Keeping under this daily goal is associated with a decreased risk of cardiovascular events, creeping  above it can lead to elevated blood pressures and increases your risk of cardiovascular events.  Milligrams (mg) of salt is listed on all nutrition labels, and your daily intake can add up faster than you think.  Most canned and frozen dinners can pack in over half your daily salt allowance in one meal.    Lifestyle Health Risks: Certain lifestyle choices carry specific health risks.  As you may already know, tobacco use has been associated with increasing one's risk of cardiovascular disease, pulmonary disease, numerous cancers, among many other issues.  What you may not know is that there are medications and nicotine replacement strategies that can more than double your chances of successfully quitting.  I would be thrilled to help manage your quitting strategy if you currently use tobacco products.  When it comes to alcohol use, I've yet to find an "ideal" daily allowance.  Provided an individual does not have a medical condition that is exacerbated by alcohol consumption, general guidelines determine "safe drinking" as no more than two standard drinks for a man or no more than one standard drink for a female per day.  However, much debate still exists on whether any amount of alcohol consumption is technically "safe".  My general advice, keep alcohol consumption to a minimum for general health promotion.  If you or others believe that alcohol, tobacco, or recreational drug use is interfering with your life, I would be happy to provide confidential counseling regarding treatment options.  General "Over The Counter" Nutrition Advice: Postmenopausal women should aim for a daily calcium intake of 1200 mg, however a significant portion of this might already be   provided by diets including milk, yogurt, cheese, and other dairy products.  Vitamin D has been shown to help preserve bone density, prevent fatigue, and has even been shown to help reduce falls in the elderly.  Ensuring a daily intake of 800 Units of  Vitamin D is a good place to start to enjoy the above benefits, we can easily check your Vitamin D level to see if you'd potentially benefit from supplementation beyond 800 Units a day.  Folic Acid intake should be of particular concern to women of childbearing age.  Daily consumption of 400-800 mcg of Folic Acid is recommended to minimize the chance of spinal cord defects in a fetus should pregnancy occur.    For many adults, accidents still remain one of the most common culprits when it comes to cause of death.  Some of the simplest but most effective preventitive habits you can adopt include regular seatbelt use, proper helmet use, securing firearms, and regularly testing your smoke and carbon monoxide detectors.  Maleny Candy B. Devonia Farro DO [facility] [address], [address] [place], [place] [address] Phone: [phone]  

## 2016-01-16 ENCOUNTER — Encounter: Payer: 59 | Admitting: Family Medicine

## 2016-02-02 MED FILL — ZOLPIDEM TARTRATE 10 MG TAB: 10 | 30 days supply | Qty: 30 | Fill #1

## 2016-02-21 MED FILL — HYDROCODON-APAP 5-325: 5-325 | 4 days supply | Qty: 14 | Fill #0

## 2016-02-21 MED FILL — AMOXICILLIN 500 MG CAPSULE: 500 | 8 days supply | Qty: 32 | Fill #0

## 2016-02-24 MED FILL — CYCLOBENZAPRINE 10 MG TAB: 10 | 10 days supply | Qty: 30 | Fill #2

## 2016-03-02 MED FILL — ZOLPIDEM TARTRATE 10 MG TAB: 10 | 30 days supply | Qty: 30 | Fill #2

## 2016-03-26 ENCOUNTER — Other Ambulatory Visit: Payer: Self-pay

## 2016-03-26 ENCOUNTER — Ambulatory Visit: Payer: 59 | Admitting: Family Medicine

## 2016-03-26 DIAGNOSIS — G4700 Insomnia, unspecified: Secondary | ICD-10-CM

## 2016-03-26 MED ORDER — ZOLPIDEM TARTRATE 10 MG PO TABS: 10.0000 mg | ORAL_TABLET | Freq: Every evening | ORAL | 2 refills | Status: DC | PRN

## 2016-03-27 ENCOUNTER — Ambulatory Visit (INDEPENDENT_AMBULATORY_CARE_PROVIDER_SITE_OTHER): Payer: 59 | Admitting: Family Medicine

## 2016-03-27 ENCOUNTER — Encounter: Payer: Self-pay | Admitting: Family Medicine

## 2016-03-27 VITALS — BP 129/84 | HR 92 | Wt 196.0 lb

## 2016-03-27 DIAGNOSIS — G4700 Insomnia, unspecified: Secondary | ICD-10-CM | POA: Diagnosis not present

## 2016-03-27 DIAGNOSIS — G245 Blepharospasm: Secondary | ICD-10-CM

## 2016-03-27 MED ORDER — ZOLPIDEM TARTRATE 10 MG PO TABS: 10.0000 mg | ORAL_TABLET | Freq: Every evening | ORAL | 3 refills | Status: DC | PRN

## 2016-03-27 NOTE — Progress Notes (Signed)
CC: Angela Benton is a 50 y.o. female is here for Eye Problem (left twitches)   Subjective: HPI:  Follow-up insomnia: She tells me that Ambien is still helping her fall asleep and stay asleep. Her job is pretty hectic right now and that she doesn't take this medication she is up all night due to being stressed out. She denies any known side effects. She is requesting a prescription to last her for 4 months since I'll be leaving in September and she'll be looking for a new provider. She denies any pain or urinary habits that are interfering with her sleep.  She's also noticed that she is having some twitching of the bottom left eyelid over the past few weeks. Happens a couple days out of the week. Usually in the evenings after work. She denies any involuntary movement of her eye or any other part of her body. She denies any vision disturbance or ocular pain.   Review Of Systems Outlined In HPI  Past Medical History:  Diagnosis Date  . Herniated lumbar intervertebral disc    L4 and L5  . Insomnia   . Sickle cell trait [facility])     Past Surgical History:  Procedure Laterality Date  . CESAREAN SECTION     Family History  Problem Relation Age of Onset  . Diabetes Mother     Social History   Social History  . Marital status: Single    Spouse name: N/A  . Number of children: N/A  . Years of education: N/A   Occupational History  . Not on file.   Social History Main Topics  . Smoking status: Never Smoker  . Smokeless tobacco: Not on file  . Alcohol use No  . Drug use: No  . Sexual activity: Not Currently   Other Topics Concern  . Not on file   Social History Narrative  . No narrative on file     Objective: BP 129/84   Pulse 92   Wt 196 lb (88.9 kg)   BMI 30.70 kg/m   Vital signs reviewed. General: Alert and Oriented, No Acute Distress HEENT: Pupils equal, round, reactive to light. Conjunctivae clear.  External ears unremarkable.  Moist mucous membranes. Lungs:  Clear and comfortable work of breathing, speaking in full sentences without accessory muscle use. Cardiac: Regular rate and rhythm.  Neuro: CN II-XII grossly intact, gait normal. Extremities: No peripheral edema.  Strong peripheral pulses.  Mental Status: No depression, anxiety, nor agitation. Logical though process. Skin: Warm and dry.  Assessment & Plan: Angela Benton was seen today for eye problem.  Diagnoses and all orders for this visit:  Insomnia -     zolpidem (AMBIEN) 10 MG tablet; Take 1 tablet (10 mg total) by mouth at bedtime as needed for sleep.  Blepharospasm   Insomnia: Controlled with Ambien Blepharospasm: She shows me a video on her phone of her lower left eyelid twitching which is classic for this diagnosis. Discussed the benign nature of this and how she can reduce the frequency by reducing caffeine in the diet and trying to reduce stress and getting more sleep.  Return in about 3 months (around 06/27/2016) for Insomnia follow-up.

## 2016-03-29 ENCOUNTER — Telehealth: Payer: Self-pay | Admitting: Family Medicine

## 2016-03-29 MED ORDER — PROBIOTIC DAILY PO CAPS: ORAL_CAPSULE | ORAL | 3 refills | Status: DC

## 2016-03-29 MED FILL — NOW PROBIOTIC-10: 100 days supply | Qty: 100 | Fill #0

## 2016-03-29 MED FILL — ZOLPIDEM TARTRATE 10 MG TAB: 10 | 30 days supply | Qty: 30 | Fill #0

## 2016-03-29 NOTE — Telephone Encounter (Signed)
Rx faxed

## 2016-03-29 NOTE — Telephone Encounter (Signed)
Angela Benton, Patient requested the Rx in your in box to be sent to the [facility].

## 2016-03-29 NOTE — Telephone Encounter (Signed)
Pt.notified

## 2016-04-10 ENCOUNTER — Telehealth: Payer: Self-pay | Admitting: Family Medicine

## 2016-04-10 MED ORDER — ESCITALOPRAM OXALATE 5 MG PO TABS: 5.0000 mg | ORAL_TABLET | Freq: Every day | ORAL | 2 refills | Status: DC

## 2016-04-10 MED ORDER — MAGNESIUM 300 MG PO CAPS: ORAL_CAPSULE | ORAL | 2 refills | Status: DC

## 2016-04-10 NOTE — Telephone Encounter (Signed)
Wants to go back on lexapro

## 2016-04-19 ENCOUNTER — Encounter: Payer: Self-pay | Admitting: Family Medicine

## 2016-04-19 ENCOUNTER — Ambulatory Visit (INDEPENDENT_AMBULATORY_CARE_PROVIDER_SITE_OTHER): Payer: 59 | Admitting: Family Medicine

## 2016-04-19 VITALS — BP 130/78 | HR 86 | Temp 98.9°F | Ht 66.75 in | Wt 193.8 lb

## 2016-04-19 DIAGNOSIS — G4762 Sleep related leg cramps: Secondary | ICD-10-CM

## 2016-04-19 DIAGNOSIS — G245 Blepharospasm: Secondary | ICD-10-CM | POA: Diagnosis not present

## 2016-04-19 DIAGNOSIS — E669 Obesity, unspecified: Secondary | ICD-10-CM

## 2016-04-19 LAB — BASIC METABOLIC PANEL WITH GFR
BUN: 8 mg/dL (ref 7–25)
CALCIUM: 9.6 mg/dL (ref 8.6–10.2)
CO2: 23 mmol/L (ref 20–31)
Chloride: 104 mmol/L (ref 98–110)
Creat: 0.77 mg/dL (ref 0.50–1.10)
GFR, Est African American: >89 mL/min (ref >=60)
Glucose, Bld: 87 mg/dL (ref 65–99)
Potassium: 4.6 mmol/L (ref 3.5–5.3)
SODIUM: 137 mmol/L (ref 135–146)

## 2016-04-19 LAB — IRON AND TIBC
%SAT: 19 % (ref 11–50)
Iron: 55 ug/dL (ref 40–190)
TIBC: 287 ug/dL (ref 250–450)
UIBC: 232 ug/dL (ref 125–400)

## 2016-04-19 NOTE — Patient Instructions (Addendum)
Exercising to Lose Weight Exercising can help you to lose weight. In order to lose weight through exercise, you need to do vigorous-intensity exercise. You can tell that you are exercising with vigorous intensity if you are breathing very hard and fast and cannot hold a conversation while exercising. Moderate-intensity exercise helps to maintain your current weight. You can tell that you are exercising at a moderate level if you have a higher heart rate and faster breathing, but you are still able to hold a conversation. HOW OFTEN SHOULD I EXERCISE? Choose an activity that you enjoy and set realistic goals. Your health care provider can help you to make an activity plan that works for you. Exercise regularly as directed by your health care provider. This may include:  Doing resistance training twice each week, such as:  Push-ups.  Sit-ups.  Lifting weights.  Using resistance bands.  Doing a given intensity of exercise for a given amount of time. Choose from these options:  150 minutes of moderate-intensity exercise every week.  75 minutes of vigorous-intensity exercise every week.  A mix of moderate-intensity and vigorous-intensity exercise every week. Children, pregnant women, people who are out of shape, people who are overweight, and older adults may need to consult a health care provider for individual recommendations. If you have any sort of medical condition, be sure to consult your health care provider before starting a new exercise program. WHAT ARE SOME ACTIVITIES THAT CAN HELP ME TO LOSE WEIGHT?   Walking at a rate of at least 4.5 miles an hour.  Jogging or running at a rate of 5 miles per hour.  Biking at a rate of at least 10 miles per hour.  Lap swimming.  Roller-skating or in-line skating.  Cross-country skiing.  Vigorous competitive sports, such as football, basketball, and soccer.  Jumping rope.  Aerobic dancing. HOW CAN I BE MORE ACTIVE IN MY DAY-TO-DAY  ACTIVITIES?  Use the stairs instead of the elevator.  Take a walk during your lunch break.  If you drive, park your car farther away from work or school.  If you take public transportation, get off one stop early and walk the rest of the way.  Make all of your phone calls while standing up and walking around.  Get up, stretch, and walk around every 30 minutes throughout the day. WHAT GUIDELINES SHOULD I FOLLOW WHILE EXERCISING?  Do not exercise so much that you hurt yourself, feel dizzy, or get very short of breath.  Consult your health care provider prior to starting a new exercise program.  Wear comfortable clothes and shoes with good support.  Drink plenty of water while you exercise to prevent dehydration or heat stroke. Body water is lost during exercise and must be replaced.  Work out until you breathe faster and your heart beats faster.   This information is not intended to replace advice given to you by your health care provider. Make sure you discuss any questions you have with your health care provider.   Document Released: 09/22/2010 Document Revised: 09/10/2014 Document Reviewed: 01/21/2014 Elsevier Interactive Patient Education 2016 Elsevier Inc. Heart-Healthy Eating Plan Many factors influence your heart health, including eating and exercise habits. Heart (coronary) risk increases with abnormal blood fat (lipid) levels. Heart-healthy meal planning includes limiting unhealthy fats, increasing healthy fats, and making other small dietary changes. This includes maintaining a healthy body weight to help keep lipid levels within a normal range. WHAT IS MY PLAN?  Your health care provider recommends that  you:  Get no more than _________% of the total calories in your daily diet from fat.  Limit your intake of saturated fat to less than _________% of your total calories each day.  Limit the amount of cholesterol in your diet to less than _________ mg per day. WHAT TYPES  OF FAT SHOULD I CHOOSE?  Choose healthy fats more often. Choose monounsaturated and polyunsaturated fats, such as olive oil and canola oil, flaxseeds, walnuts, almonds, and seeds.  Eat more omega-3 fats. Good choices include salmon, mackerel, sardines, tuna, flaxseed oil, and ground flaxseeds. Aim to eat fish at least two times each week.  Limit saturated fats. Saturated fats are primarily found in animal products, such as meats, butter, and cream. Plant sources of saturated fats include palm oil, palm kernel oil, and coconut oil.  Avoid foods with partially hydrogenated oils in them. These contain trans fats. Examples of foods that contain trans fats are stick margarine, some tub margarines, cookies, crackers, and other baked goods. WHAT GENERAL GUIDELINES DO I NEED TO FOLLOW?  Check food labels carefully to identify foods with trans fats or high amounts of saturated fat.  Fill one half of your plate with vegetables and green salads. Eat 4-5 servings of vegetables per day. A serving of vegetables equals 1 cup of raw leafy vegetables,  cup of raw or cooked cut-up vegetables, or  cup of vegetable juice.  Fill one fourth of your plate with whole grains. Look for the word "whole" as the first word in the ingredient list.  Fill one fourth of your plate with lean protein foods.  Eat 4-5 servings of fruit per day. A serving of fruit equals one medium whole fruit,  cup of dried fruit,  cup of fresh, frozen, or canned fruit, or  cup of 100% fruit juice.  Eat more foods that contain soluble fiber. Examples of foods that contain this type of fiber are apples, broccoli, carrots, beans, peas, and barley. Aim to get 20-30 g of fiber per day.  Eat more home-cooked food and less restaurant, buffet, and fast food.  Limit or avoid alcohol.  Limit foods that are high in starch and sugar.  Avoid fried foods.  Cook foods by using methods other than frying. Baking, boiling, grilling, and broiling are  all great options. Other fat-reducing suggestions include:  Removing the skin from poultry.  Removing all visible fats from meats.  Skimming the fat off of stews, soups, and gravies before serving them.  Steaming vegetables in water or broth.  Lose weight if you are overweight. Losing just 5-10% of your initial body weight can help your overall health and prevent diseases such as diabetes and heart disease.  Increase your consumption of nuts, legumes, and seeds to 4-5 servings per week. One serving of dried beans or legumes equals  cup after being cooked, one serving of nuts equals 1 ounces, and one serving of seeds equals  ounce or 1 tablespoon.  You may need to monitor your salt (sodium) intake, especially if you have high blood pressure. Talk with your health care provider or dietitian to get more information about reducing sodium. WHAT FOODS CAN I EAT? Grains Breads, including [place], white, pita, wheat, raisin, rye, oatmeal, and [place]. Tortillas that are neither fried nor made with lard or trans fat. Low-fat rolls, including hotdog and hamburger buns and English muffins. Biscuits. Muffins. Waffles. Pancakes. Light popcorn. Whole-grain cereals. Flatbread. Melba toast. Pretzels. Breadsticks. Rusks. Low-fat snacks and crackers, including oyster, saltine, matzo,  graham, animal, and rye. Rice and pasta, including brown rice and those that are made with whole wheat. Vegetables All vegetables. Fruits All fruits, but limit coconut. Meats and Other Protein Sources Lean, well-trimmed beef, veal, pork, and lamb. Chicken and turkey without skin. All fish and shellfish. Wild duck, rabbit, pheasant, and venison. Egg whites or low-cholesterol egg [place]stitutes. Dried beans, peas, lentils, and tofu.Seeds and most nuts. Dairy Low-fat or nonfat cheeses, including ricotta, string, and mozzarella. Skim or 1% milk that is liquid, powdered, or evaporated. Buttermilk that is made with low-fat milk. Nonfat  or low-fat yogurt. Beverages Mineral water. Diet carbonated beverages. Sweets and Desserts Sherbets and fruit ices. Honey, jam, marmalade, jelly, and syrups. Meringues and gelatins. Pure sugar candy, such as hard candy, jelly beans, gumdrops, mints, marshmallows, and small amounts of dark chocolate. MGM MIR[employer]el food cake. Eat all sweets and desserts in moderation. Fats and Oils Nonhydrogenated (trans-free) margarines. Vegetable oils, including soybean, sesame, sunflower, olive, peanut, safflower, corn, canola, and cottonseed. Salad dressings or mayonnaise that are made with a vegetable oil. Limit added fats and oils that you use for cooking, baking, salads, and as spreads. Other Cocoa powder. Coffee and tea. All seasonings and condiments. The items listed above may not be a complete list of recommended foods or beverages. Contact your dietitian for more options. WHAT FOODS ARE NOT RECOMMENDED? Grains Breads that are made with saturated or trans fats, oils, or whole milk. Croissants. Butter rolls. Cheese breads. Sweet rolls. Donuts. Buttered popcorn. Chow mein noodles. High-fat crackers, such as cheese or butter crackers. Meats and Other Protein Sources Fatty meats, such as hotdogs, short ribs, sausage, spareribs, bacon, ribeye roast or steak, and mutton. High-fat deli meats, such as salami and bologna. Caviar. Domestic duck and goose. Organ meats, such as kidney, liver, sweetbreads, brains, gizzard, chitterlings, and heart. Dairy Cream, sour cream, cream cheese, and creamed cottage cheese. Whole milk cheeses, including blue (bleu), [address], [place], [place], [address], [place], [address], [place], [place], and [place]. Whole or 2% milk that is liquid, evaporated, or condensed. Whole buttermilk. Cream sauce or high-fat cheese sauce. Yogurt that is made from whole milk. Beverages Regular sodas and drinks with added sugar. Sweets and Desserts Frosting. Pudding. Cookies. Cakes other than angel food cake.  Candy that has milk chocolate or white chocolate, hydrogenated fat, butter, coconut, or unknown ingredients. Buttered syrups. Full-fat ice cream or ice cream drinks. Fats and Oils Gravy that has suet, meat fat, or shortening. Cocoa butter, hydrogenated oils, palm oil, coconut oil, palm kernel oil. These can often be found in baked products, candy, fried foods, nondairy creamers, and whipped toppings. Solid fats and shortenings, including bacon fat, salt pork, lard, and butter. Nondairy cream substitutes, such as coffee creamers and sour cream substitutes. Salad dressings that are made of unknown oils, cheese, or sour cream. The items listed above may not be a complete list of foods and beverages to avoid. Contact your dietitian for more information.   This information is not intended to replace advice given to you by your health care provider. Make sure you discuss any questions you have with your health care provider.   Document Released: 05/29/2008 Document Revised: 09/10/2014 Document Reviewed: 02/11/2014 Elsevier Interactive Patient Education [employer]2016 Elsevier Inc.

## 2016-04-19 NOTE — Progress Notes (Signed)
Chief Complaint  Patient presents with  . Establish Care    pt want to discuss weight,leg cramps       New Patient Visit SUBJECTIVE: HPI: Angela Benton is an 50 y.o.female who is being seen for establishing care.  The patient was previously seen with Dr. Ivan AnchorsHommel who is leaving his practice. She is new to me, not the practice.  Health maintenance history: Complete physical: 01/2016 Pap/pelvic: Due at end of the year Mammogram: 06/2015 Routine blood work: 01/2016 Tetanus: 02/2011  Nocturnal cramping She has been having leg cramps at night. This has been going on for the past 6 mo. She does take magnesium, but does not remember to take it daily. Her water intake is poor secondary to drinking a lot of soda. She does not feel the urge to move her legs. She has not had her iron levels checked in 4 years.  Weight loss She has been feeling low energy and believes that she needs to lose weight. She drinks soda and eats McDonald's breakfast frequently. She does not exercise routinely. Her thyroid levels were normal when recently checked.  Eyelid spasm She is also continuing to have spasm of her eyelid. She tied to cut down on caffeine, but does admit that her life is still stressful with work ([employer]).  No Known Allergies  Past Medical History:  Diagnosis Date  . Herniated lumbar intervertebral disc    L4 and L5  . History of chicken pox   . Insomnia   . Sickle cell trait [facility](HCC)    Past Surgical History:  Procedure Laterality Date  . CESAREAN SECTION     Social History   Social History  . Marital status: Single   Social History Main Topics  . Smoking status: Never Smoker  . Smokeless tobacco: Never Used  . Alcohol use No  . Drug use: No  . Sexual activity: Not Currently   Family History  Problem Relation Age of Onset  . Diabetes Mother   . Cancer Maternal Aunt     Throat    Current Outpatient Prescriptions:  .  cyclobenzaprine (FLEXERIL) 10 MG tablet, One half tab PO  qHS, then increase gradually to one tab TID. Only use as needed (Patient taking differently: One half tab PO qHS, then increase gradually to one tab TID. Only use as needed), Disp: 30 tablet, Rfl: 2 .  Magnesium 300 MG CAPS, One by mouth daily to prevent spasms. May use 400mg  formulation if needed., Disp: 30 capsule, Rfl: 2 .  Probiotic Product (PROBIOTIC DAILY) CAPS, One by mouth daily.  May substitute per insurance formulary., Disp: 90 capsule, Rfl: 3 .  zolpidem (AMBIEN) 10 MG tablet, Take 1 tablet (10 mg total) by mouth at bedtime as needed for sleep., Disp: 30 tablet, Rfl: 3 .  escitalopram (LEXAPRO) 5 MG tablet, Take 1 tablet (5 mg total) by mouth daily. (Patient not taking: Reported on 04/19/2016), Disp: 30 tablet, Rfl: 2  Patient's last menstrual period was 03/12/2016 (approximate).  ROS Endocrine: +weight gain  MSK: +Leg cramping, no current muscle pain   OBJECTIVE: BP 130/78 (BP Location: Left Arm, Patient Position: Sitting, Cuff Size: Normal)   Pulse 86   Temp 98.9 F (37.2 C) (Oral)   Ht 5' 6.75" (1.695 m)   Wt 193 lb 12.8 oz (87.9 kg)   LMP 03/12/2016 (Approximate)   SpO2 98%   BMI 30.58 kg/m   Constitutional: -  VS reviewed -  Well developed, well nourished, appears stated age -  No apparent distress  Psychiatric: -  Oriented to person, place, and time -  Memory intact -  Affect and mood normal -  Fluent conversation, good eye contact -  Judgment and insight age appropriate  Eye: -  Conjunctivae clear, no discharge -  I do not appreciate any spasm at this time  ENMT: -  Ears are patent b/l without erythema or discharge. TM's are shiny and clear b/l without evidence of effusion or infection. -  Oral mucosa without lesions, tongue and uvula midline    Tonsils not enlarged, no erythema, no exudate, trachea midline    Pharynx moist, no lesions, no erythema  Neck: -  No gross swelling, no palpable masses -  Thyroid midline, not enlarged, mobile, no palpable masses   Cardiovascular: -  RRR, no murmurs -  No LE edema  Respiratory: -  Normal respiratory effort, no accessory muscle use, no retraction -  Breath sounds equal, no wheezes, no ronchi, no crackles  Neurological:  -  CN II - XII grossly intact -  Sensation grossly intact to light touch, equal bilaterally  Musculoskeletal: -  No clubbing, no cyanosis -  Gait normal -  No calf TTP or spasm  Skin: -  No significant lesion on inspection -  Warm and dry to palpation   ASSESSMENT/PLAN: Nocturnal leg cramps - Plan: BASIC METABOLIC PANEL WITH GFR, Magnesium, IBC panel, Ferritin, Iron Binding Cap (TIBC)  Obesity  Blepharospasm  Orders as above. Also instructed to cut down on soda intake and increase her water intake.  Written information regarding exercising and diet provided. Extensive counseling for healthy food options and safe initiation of exercise done today. Patient should return in 3 mo; to bring food diary. Will see sooner pending lab results. The patient voiced understanding and agreement to the plan.  Total face to face time was >25 min with greater than 50% of this time spent direct counseling and coordination of care.  Jilda RocheNicholas Paul KeddieWendling

## 2016-04-19 NOTE — Progress Notes (Signed)
Pre visit review using our clinic review tool, if applicable. No additional management support is needed unless otherwise documented below in the visit note. 

## 2016-04-20 LAB — IBC PANEL
IRON: 52 ug/dL (ref 42–145)
Saturation Ratios: 16.6 % — ABNORMAL LOW (ref 20.0–50.0)
TRANSFERRIN: 224 mg/dL (ref 212.0–360.0)

## 2016-04-20 LAB — FERRITIN: Ferritin: 68.2 ng/mL (ref 10.0–291.0)

## 2016-04-20 LAB — MAGNESIUM: MAGNESIUM: 2.1 mg/dL (ref 1.5–2.5)

## 2016-04-20 MED FILL — ESCITALOPRAM 5 MG TABLET: 5 | 30 days supply | Qty: 30 | Fill #0

## 2016-04-27 MED FILL — ZOLPIDEM TARTRATE 10 MG TAB: 10 | 30 days supply | Qty: 30 | Fill #1

## 2016-05-08 ENCOUNTER — Encounter: Payer: Self-pay | Admitting: Family Medicine

## 2016-05-09 ENCOUNTER — Other Ambulatory Visit: Payer: Self-pay

## 2016-05-09 DIAGNOSIS — M5416 Radiculopathy, lumbar region: Secondary | ICD-10-CM

## 2016-05-09 MED ORDER — CYCLOBENZAPRINE HCL 10 MG PO TABS: ORAL_TABLET | ORAL | 2 refills | Status: DC

## 2016-05-09 MED FILL — CYCLOBENZAPRINE 10 MG TAB: 10 | 10 days supply | Qty: 30 | Fill #0

## 2016-05-28 MED FILL — ZOLPIDEM TARTRATE 10 MG TAB: 10 | 30 days supply | Qty: 30 | Fill #2

## 2016-06-11 ENCOUNTER — Telehealth: Payer: Self-pay | Admitting: Family Medicine

## 2016-06-11 NOTE — Telephone Encounter (Signed)
Per Dr. Carmelia RollerWendling okay for pt to come in for TB skin test.//AB/CMA

## 2016-06-11 NOTE — Telephone Encounter (Signed)
Caller name: [name]  Relation to ZO:XWRUpt:self Call back number:[phone] Pharmacy:  Reason for call: Pt requesting is needing a TB Test done, please if possible to put order since pt is needing for work ASAP. Please advise.

## 2016-06-18 ENCOUNTER — Ambulatory Visit (INDEPENDENT_AMBULATORY_CARE_PROVIDER_SITE_OTHER): Payer: 59 | Admitting: *Deleted

## 2016-06-18 DIAGNOSIS — Z111 Encounter for screening for respiratory tuberculosis: Secondary | ICD-10-CM | POA: Diagnosis not present

## 2016-06-18 NOTE — Progress Notes (Signed)
Pre visit review using our clinic review tool, if applicable. No additional management support is needed unless otherwise documented below in the visit note.  Pt here for PPD per 06/11/16 phone note.  PPD Placement note Angela Benton, 50 y.o. female is here today for placement of PPD test Reason for PPD test: required by employer  Pt taken PPD test before: yes Verified in allergy area and with patient that they are not allergic to the products PPD is made of (Phenol or Tween). Yes  Has the patient ever received the BCG vaccine?: no Has the patient been in recent contact with anyone known or suspected of having active TB disease?: no      O: Alert and oriented in NAD. P:  PPD placed on 06/18/2016.  Patient advised to return for reading within 48-72 hours.  Angela Linkarolyn J Presly Steinruck, RN

## 2016-06-20 LAB — TB SKIN TEST
INDURATION: 0 mm
TB SKIN TEST: NEGATIVE

## 2016-06-26 MED FILL — ZOLPIDEM TARTRATE 10 MG TAB: 10 | 30 days supply | Qty: 30 | Fill #0

## 2016-07-09 ENCOUNTER — Emergency Department (HOSPITAL_BASED_OUTPATIENT_CLINIC_OR_DEPARTMENT_OTHER): Payer: 59

## 2016-07-09 ENCOUNTER — Emergency Department (HOSPITAL_BASED_OUTPATIENT_CLINIC_OR_DEPARTMENT_OTHER)
Admission: EM | Admit: 2016-07-09 | Discharge: 2016-07-09 | Disposition: A | Discharge status: Home / Self Care | Payer: 59 | Attending: Emergency Medicine | Admitting: Emergency Medicine

## 2016-07-09 ENCOUNTER — Encounter (HOSPITAL_BASED_OUTPATIENT_CLINIC_OR_DEPARTMENT_OTHER): Payer: Self-pay | Admitting: *Deleted

## 2016-07-09 DIAGNOSIS — R079 Chest pain, unspecified: Secondary | ICD-10-CM | POA: Insufficient documentation

## 2016-07-09 DIAGNOSIS — R0602 Shortness of breath: Secondary | ICD-10-CM | POA: Diagnosis not present

## 2016-07-09 LAB — BASIC METABOLIC PANEL
ANION GAP: 7 (ref 5–15)
BUN: 8 mg/dL (ref 6–20)
CALCIUM: 9.3 mg/dL (ref 8.9–10.3)
CHLORIDE: 105 mmol/L (ref 101–111)
CO2: 24 mmol/L (ref 22–32)
Creatinine, Ser: 0.66 mg/dL (ref 0.44–1.00)
GFR calc non Af Amer: >60 mL/min (ref >60)
GLUCOSE: 104 mg/dL — AB (ref 65–99)
POTASSIUM: 3.4 mmol/L — AB (ref 3.5–5.1)
Sodium: 136 mmol/L (ref 135–145)

## 2016-07-09 LAB — CBC
HEMATOCRIT: 40 % (ref 36.0–46.0)
HEMOGLOBIN: 14.5 g/dL (ref 12.0–15.0)
MCH: 27.2 pg (ref 26.0–34.0)
MCHC: 36.3 g/dL — AB (ref 30.0–36.0)
MCV: 75 fL — ABNORMAL LOW (ref 78.0–100.0)
Platelets: 212 10*3/uL (ref 150–400)
RBC: 5.33 MIL/uL — AB (ref 3.87–5.11)
RDW: 13.6 % (ref 11.5–15.5)
WBC: 9.3 10*3/uL (ref 4.0–10.5)

## 2016-07-09 LAB — TROPONIN I: Troponin I: <0.03 ng/mL (ref <0.03)

## 2016-07-09 MED ORDER — ASPIRIN 81 MG PO CHEW
324.0000 mg | CHEWABLE_TABLET | Freq: Once | ORAL | Status: AC
  Administered 2016-07-09: 324 mg via ORAL
  Filled 2016-07-09: qty 4

## 2016-07-09 MED ORDER — IBUPROFEN 800 MG PO TABS
800.0000 mg | ORAL_TABLET | Freq: Once | ORAL | Status: AC
  Administered 2016-07-09: 800 mg via ORAL
  Filled 2016-07-09: qty 1

## 2016-07-09 NOTE — Discharge Instructions (Signed)

## 2016-07-09 NOTE — ED Provider Notes (Signed)
Emergency Department Provider Note  By signing my name below, I, Teofilo Pod, attest that this documentation has been prepared under the direction and in the presence of Maia Plan, MD . Electronically Signed: Teofilo Pod, ED Scribe. 07/09/2016. 3:46 PM.   I have reviewed the triage vital signs and the nursing notes.   HISTORY  Chief Complaint Chest Pain and Shortness of Breath   HPI Angela Benton is a 50 y.o. female who presents to the Emergency Department complaining of intermittent, recently worsening chest pain x 2 weeks. Pt states that the pain radiates into her right shoulder. Pt complains of associated SOB x 2 days. Per triage note, pt rates her pain at 7/10. Pt states that her most recent chest pain onset was 1 hour ago. Pt denies any history of DM, HLD. Pt is not a smoker. Pt reports PFHx of MI, notes several family members who had MI in their mid-50s. No alleviating factors noted. Pt denies other associated symptoms.    Past Medical History:  Diagnosis Date  . Herniated lumbar intervertebral disc    L4 and L5  . History of chicken pox   . Insomnia   . Sickle cell trait [facility])     Patient Active Problem List   Diagnosis Date Noted  . Overweight 03/28/2015  . Elevated blood pressure 12/23/2014  . Abnormal weight gain 06/25/2014  . Dyslipidemia 04/06/2014  . Right lumbar radiculitis 02/01/2014  . Vitamin D deficiency 06/24/2012  . Sickle cell trait (HCC) 06/20/2012  . Insomnia 10/29/2011  . Herniated lumbar intervertebral disc 10/29/2011  . Childhood asthma 10/29/2011    Past Surgical History:  Procedure Laterality Date  . CESAREAN SECTION      Current Outpatient Rx  . Order #: [id number] Class: Normal  . Order #: [id number] Class: Normal  . Order #: [id number] Class: Print  . Order #: [id number] Class: Print  . Order #: [id number] Class: Print    Allergies Patient has no known allergies.  Family History  Problem Relation Age of Onset  .  Diabetes Mother   . Cancer Maternal Aunt     Throat    Social History Social History  Substance Use Topics  . Smoking status: Never Smoker  . Smokeless tobacco: Never Used  . Alcohol use No    Review of Systems  Constitutional: No fever/chills Eyes: No visual changes. ENT: No sore throat. Cardiovascular: Positive for chest pain. Respiratory: Positive for shortness of breath. Gastrointestinal: No abdominal pain.  No nausea, no vomiting.  No diarrhea.  No constipation. Genitourinary: Negative for dysuria. Musculoskeletal: Negative for back pain. Skin: Negative for rash. Neurological: Negative for headaches, focal weakness or numbness.  10-point ROS otherwise negative.  ____________________________________________   PHYSICAL EXAM:  VITAL SIGNS: ED Triage Vitals  Enc Vitals Group     BP 07/09/16 1508 155/96     Pulse Rate 07/09/16 1508 80     Resp 07/09/16 1508 18     Temp 07/09/16 1508 98.2 F (36.8 C)     Temp Source 07/09/16 1508 Oral     SpO2 07/09/16 1508 97 %     Weight 07/09/16 1505 193 lb (87.5 kg)     Height 07/09/16 1505 5' 6.5" (1.689 m)     Pain Score 07/09/16 1506 7   Constitutional: Alert and oriented. Well appearing and in no acute distress. Eyes: Conjunctivae are normal.  Head: Atraumatic. Nose: No congestion/rhinnorhea. Mouth/Throat: Mucous membranes are moist.  Oropharynx non-erythematous. Neck: No stridor. Cardiovascular: Normal  rate, regular rhythm. Good peripheral circulation. Grossly normal heart sounds.   Respiratory: Normal respiratory effort.  No retractions. Lungs CTAB. Gastrointestinal: Soft and nontender. No distention.  Musculoskeletal: No lower extremity tenderness nor edema. No gross deformities of extremities. Neurologic:  Normal speech and language. No gross focal neurologic deficits are appreciated.  Skin:  Skin is warm, dry and intact. No rash noted.   ____________________________________________   LABS (all labs ordered  are listed, but only abnormal results are displayed)  Labs Reviewed  BASIC METABOLIC PANEL - Abnormal; Notable for the following:       Result Value   Potassium 3.4 (*)    Glucose, Bld 104 (*)    All other components within normal limits  CBC - Abnormal; Notable for the following:    RBC 5.33 (*)    MCV 75.0 (*)    MCHC 36.3 (*)    All other components within normal limits  TROPONIN I  TROPONIN I   ____________________________________________  EKG   EKG Interpretation  Date/Time:  Monday July 09 2016 15:06:32 EST Ventricular Rate:  83 PR Interval:  142 QRS Duration: 84 QT Interval:  378 QTC Calculation: 444 R Axis:   71 Text Interpretation:  Normal sinus rhythm Nonspecific inferior T wave changes.  No STEMI.  Confirmed by Kaizen Ibsen MD, Timber Marshman [phone] on 07/09/2016 3:45:03 PM       ____________________________________________  RADIOLOGY  Dg Chest 2 View  Result Date: 07/09/2016 CLINICAL DATA:  Right-sided chest pain radiating to the right shoulder associated with shortness of breath for the past 2 days EXAM: CHEST  2 VIEW COMPARISON:  None in PACs FINDINGS: The lungs are mildly hyperinflated. There is no focal infiltrate. The heart and pulmonary vascularity are normal. The mediastinum is normal in width. There is no pleural effusion. There is mild multilevel degenerative disc disease of the thoracic spine. IMPRESSION: Mild hyperinflation may be voluntary or may reflect air trapping secondary to reactive airway disease or COPD. There is no pneumonia, CHF, nor other acute cardiopulmonary abnormality. Electronically Signed   By: David  SwazilandJordan M.D.   On: 07/09/2016 15:39    ____________________________________________   PROCEDURES  Procedure(s) performed:   Procedures  None ____________________________________________   INITIAL IMPRESSION / ASSESSMENT AND PLAN / ED COURSE  Pertinent labs & imaging results that were available during my care of the patient were  reviewed by me and considered in my medical decision making (see chart for details).  With no significant PMH of HLD presents to the emergency department for evaluation of chest pain and dyspnea at rest. Patient reports 2 weeks of intermittent symptoms that became worse while at work today. On presentation to the emergency department to have largely resolved. No prior history of ACS. HEART score 3 (EKG, age, and HLD). Plan for serial biomarkers and reassessment. She is low risk for pulmonary embolism and PERC negative.   Repeat troponin is negative. Patient remains chest pain free. Will follow with both PCP and Cardiology to discuss outpatient stress testing.   At this time, I do not feel there is any life-threatening condition present. I have reviewed and discussed all results (EKG, imaging, lab, urine as appropriate), exam findings with patient. I have reviewed nursing notes and appropriate previous records.  I feel the patient is safe to be discharged home without further emergent workup. Discussed usual and customary return precautions. Patient and family (if present) verbalize understanding and are comfortable with this plan.  Patient will follow-up with their primary care  provider. If they do not have a primary care provider, information for follow-up has been provided to them. All questions have been answered.  ____________________________________________  FINAL CLINICAL IMPRESSION(S) / ED DIAGNOSES  Final diagnoses:  Chest pain, unspecified type     MEDICATIONS GIVEN DURING THIS VISIT:  Medications  aspirin chewable tablet 324 mg (324 mg Oral Given 07/09/16 1613)  ibuprofen (ADVIL,MOTRIN) tablet 800 mg (800 mg Oral Given 07/09/16 1755)     NEW OUTPATIENT MEDICATIONS STARTED DURING THIS VISIT:  None   Note:  This document was prepared using Dragon voice recognition software and may include unintentional dictation errors.  Alona BeneJoshua Roquel Burgin, MD Emergency Medicine  I personally  performed the services described in this documentation, which was scribed in my presence. The recorded information has been reviewed and is accurate.       Maia PlanJoshua G Audry Pecina, MD 07/10/16 [phone]

## 2016-07-09 NOTE — ED Notes (Signed)
Patient reports that she is having some new right sided chest pain that is worse than when she came in. Noted irregular HR on monitor. After talking with patient from a few minutes, reports that the pain had decreased. The patient had one episode of elevation in her BP  - MD made aware

## 2016-07-09 NOTE — ED Triage Notes (Signed)
Chest pain for a week. Pain radiates into her right shoulder. Sob x 2 days.

## 2016-07-11 ENCOUNTER — Encounter: Payer: Self-pay | Admitting: Cardiology

## 2016-07-11 ENCOUNTER — Ambulatory Visit (INDEPENDENT_AMBULATORY_CARE_PROVIDER_SITE_OTHER): Payer: 59 | Admitting: Cardiology

## 2016-07-11 VITALS — BP 147/86 | HR 78 | Ht 66.5 in | Wt 192.8 lb

## 2016-07-11 DIAGNOSIS — R072 Precordial pain: Secondary | ICD-10-CM

## 2016-07-11 NOTE — Patient Instructions (Signed)
Your physician has requested that you have an exercise tolerance test. For further information please visit [email or web address]. Please also follow instruction sheet, as given.   Your physician recommends that you schedule a follow-up appointment in: AS NEEDED PENDING TEST RESULTS  Exercise Stress Electrocardiogram An exercise stress electrocardiogram is a test to check how blood flows to your heart. It is done to find areas of poor blood flow. You will need to walk on a treadmill for this test. The electrocardiogram will record your heartbeat when you are at rest and when you are exercising. BEFORE THE PROCEDURE  Do not have drinks with caffeine or foods with caffeine for 24 hours before the test, or as told by your doctor. This includes coffee, tea (even decaf tea), sodas, chocolate, and cocoa.  Follow your doctor's instructions about eating and drinking before the test.  Ask your doctor what medicines you should or should not take before the test. Take your medicines with water unless told by your doctor not to.  If you use an inhaler, bring it with you to the test.  Bring a snack to eat after the test.  Do not  smoke for 4 hours before the test.  Do not put lotions, powders, creams, or oils on your chest before the test.  Wear comfortable shoes and clothing. PROCEDURE  You will have patches put on your chest. Small areas of your chest may need to be shaved. Wires will be connected to the patches.  Your heart rate will be watched while you are resting and while you are exercising.  You will walk on the treadmill. The treadmill will slowly get faster to raise your heart rate.  The test will take about 1-2 hours. AFTER THE PROCEDURE  Your heart rate and blood pressure will be watched after the test.  You may return to your normal diet, activities, and medicines or as told by your doctor.   This information is not intended to replace advice given to you by your health care  provider. Make sure you discuss any questions you have with your health care provider.   Document Released: 02/06/2008 Document Revised: 09/10/2014 Document Reviewed: 04/27/2013 Elsevier Interactive Patient Education 2016 Elsevier Inc.   

## 2016-07-11 NOTE — Progress Notes (Signed)
HPI: 50 year old female for evaluation of chest pain. Patient seen in the emergency room on November 6 with chest pain. Chest x-ray showed mild hyperinflation. Troponins were normal. Hemoglobin normal. K 3.4. Patient typically does not have significant dyspnea on exertion, orthopnea, PND, pedal edema, palpitations, syncope or exertional chest pain. Over the past 2 weeks she has had occasional pain in the right substernal area. It radiates to her right shoulder. It lasts 2-3 minutes. It is described as a sharp pain. There is associated dyspnea but no nausea or vomiting and no diaphoresis. Some increase with inspiration and after eating. No exertional chest pain.  Current Outpatient Prescriptions  Medication Sig Dispense Refill  . cyclobenzaprine (FLEXERIL) 10 MG tablet One half tab PO qHS, then increase gradually to one tab TID. Only use as needed 30 tablet 2  . Probiotic Product (PROBIOTIC DAILY) CAPS One by mouth daily.  May substitute per insurance formulary. 90 capsule 3  . zolpidem (AMBIEN) 10 MG tablet Take 1 tablet (10 mg total) by mouth at bedtime as needed for sleep. 30 tablet 3   No current facility-administered medications for this visit.     No Known Allergies   Past Medical History:  Diagnosis Date  . Herniated lumbar intervertebral disc    L4 and L5  . History of chicken pox   . Insomnia   . Sickle cell trait [facility](HCC)     Past Surgical History:  Procedure Laterality Date  . CESAREAN SECTION      Social History   Social History  . Marital status: Single    Spouse name: N/A  . Number of children: 3  . Years of education: N/A   Occupational History  . Not on file.   Social History Main Topics  . Smoking status: Never Smoker  . Smokeless tobacco: Never Used  . Alcohol use Yes     Comment: Rarely  . Drug use: No  . Sexual activity: Not Currently   Other Topics Concern  . Not on file   Social History Narrative  . No narrative on file    Family History   Problem Relation Age of Onset  . Diabetes Mother   . CAD Mother   . Cancer Maternal Aunt     Throat    ROS: no fevers or chills, productive cough, hemoptysis, dysphasia, odynophagia, melena, hematochezia, dysuria, hematuria, rash, seizure activity, orthopnea, PND, pedal edema, claudication. Remaining systems are negative.  Physical Exam:   Blood pressure (!) 147/86, pulse 78, height 5' 6.5" (1.689 m), weight 192 lb 12.8 oz (87.5 kg), last menstrual period 07/07/2016.  General:  Well developed/well nourished in NAD Skin warm/dry Patient not depressed No peripheral clubbing Back-normal HEENT-normal/normal eyelids Neck supple/normal carotid upstroke bilaterally; no bruits; no JVD; no thyromegaly chest - CTA/ normal expansion CV - RRR/normal S1 and S2; no murmurs, rubs or gallops;  PMI nondisplaced Abdomen -NT/ND, no HSM, no mass, + bowel sounds, no bruit 2+ femoral pulses, no bruits Ext-no edema, chords, 2+ DP Neuro-grossly nonfocal  ECG 07/09/2016-sinus rhythm with no ST changes. 07/11/2016-sinus rhythm at a rate of 78. Cannot rule out prior septal infarct. No ST changes.  A/P  1 chest pain-symptoms atypical. EKG without ST changes. Troponins normal. No risk factors for pulmonary embolus. Possibly musculoskeletal. She is contemplating an exercise program. We will arrange an exercise treadmill for risk stratification.  2 elevated blood pressure-she does not have a diagnosis of hypertension. I have asked her to track this and  we will add medications as needed.  3 microcytosis-noted on recent CBC. She has a history of sickle cell trait and she does menstruate. Further evaluation per primary care.   Olga MillersBrian Crenshaw, MD

## 2016-07-12 ENCOUNTER — Inpatient Hospital Stay (HOSPITAL_COMMUNITY): Admission: RE | Admit: 2016-07-12 | Payer: 59 | Source: Ambulatory Visit

## 2016-07-25 MED FILL — ZOLPIDEM TARTRATE 10 MG TAB: 10 | 30 days supply | Qty: 30 | Fill #1

## 2016-07-30 ENCOUNTER — Encounter: Payer: Self-pay | Admitting: Physician Assistant

## 2016-07-30 ENCOUNTER — Ambulatory Visit (INDEPENDENT_AMBULATORY_CARE_PROVIDER_SITE_OTHER): Payer: 59 | Admitting: Physician Assistant

## 2016-07-30 VITALS — BP 156/73 | HR 76 | Ht 66.5 in | Wt 195.0 lb

## 2016-07-30 DIAGNOSIS — Z6831 Body mass index (BMI) 31.0-31.9, adult: Secondary | ICD-10-CM

## 2016-07-30 DIAGNOSIS — R030 Elevated blood-pressure reading, without diagnosis of hypertension: Secondary | ICD-10-CM

## 2016-07-30 DIAGNOSIS — E6609 Other obesity due to excess calories: Secondary | ICD-10-CM | POA: Diagnosis not present

## 2016-07-30 DIAGNOSIS — F5101 Primary insomnia: Secondary | ICD-10-CM | POA: Diagnosis not present

## 2016-07-30 DIAGNOSIS — E669 Obesity, unspecified: Secondary | ICD-10-CM | POA: Insufficient documentation

## 2016-07-30 MED ORDER — ZOLPIDEM TARTRATE 10 MG PO TABS: 10.0000 mg | ORAL_TABLET | Freq: Every evening | ORAL | 5 refills | Status: DC | PRN

## 2016-07-30 NOTE — Progress Notes (Addendum)
Subjective:    Patient ID: Angela Benton, female    DOB: [birth year], 50 y.o.   MRN: [id number]  HPI Patient is a 50 yo female coming to the office to establish. Patient needs a refill on Ambien. she has been well controlled on this for over a year.  Patient also states that she had a work-up for chest pain on July 09, 2016 in the Emergency Department. Patient reports that the work-up was negative, and she was then sent to the cardiologist, Dr. Jens Somrenshaw. Per the patient, the cardiologist told her that she had inflammation around her heart and that she needs to eat healthier and exercise. Patient denies any current chest pain or pressure. Patient denies shortness of breath, ,dyspnea or exertion or palpitations.    Past Medical History:  Diagnosis Date  . Herniated lumbar intervertebral disc    L4 and L5  . History of chicken pox   . Insomnia   . Sickle cell trait (HCC)      Medication List       Accurate as of 07/30/16  5:47 PM. Always use your most recent med list.          cyclobenzaprine 10 MG tablet Commonly known as:  FLEXERIL One half tab PO qHS, then increase gradually to one tab TID. Only use as needed   PROBIOTIC DAILY Caps One by mouth daily.  May substitute per insurance formulary.   zolpidem 10 MG tablet Commonly known as:  AMBIEN Take 1 tablet (10 mg total) by mouth at bedtime as needed for sleep.      No Known Allergies   Social History  Substance Use Topics  . Smoking status: Never Smoker  . Smokeless tobacco: Never Used  . Alcohol use Yes     Comment: Rarely    Family History  Problem Relation Age of Onset  . Diabetes Mother   . CAD Mother   . Cancer Maternal Aunt     Throat    Review of Systems  Respiratory: Negative for cough, chest tightness, shortness of breath and wheezing.   Cardiovascular: Negative for chest pain and palpitations.  Neurological: Negative for dizziness, light-headedness and numbness.  Psychiatric/Behavioral: Negative  for agitation, behavioral problems, dysphoric mood and sleep disturbance.       Objective:   Physical Exam  Constitutional: She appears well-developed and well-nourished. No distress.  HENT:  Head: Normocephalic and atraumatic.  Cardiovascular: Normal rate and regular rhythm.  Exam reveals no gallop and no friction rub.   No murmur heard. Pulmonary/Chest: Effort normal and breath sounds normal. No respiratory distress. She has no wheezes. She has no rales. She exhibits no tenderness.  Psychiatric: She has a normal mood and affect. Her behavior is normal.   Vitals:   07/30/16 1057  BP: (!) 156/73  Pulse: 76         Assessment & Plan:  Diagnoses and all orders for this visit:  1. Class 1 obesity due to excess calories without serious comorbidity with body mass index (BMI) of 31.0 to 31.9 in adult  Patient agreed to decreasing fatty foods and eating more fruits and vegetables. Discussed 1500 calorie diet and 150 minutes of exercise a week. Discussed medications and patient declined at this time. Will re-evaluate weight in 6 months.   2. Primary insomnia -     zolpidem (AMBIEN) 10 MG tablet; Take 1 tablet (10 mg total) by mouth at bedtime as needed for sleep.    Continue taking Ambien one  tablet at bedtime. Patient was educated to not drive while taking this medication. Patient agreed to this. Will re-evaluate in 6 months.   3. Elevated blood pressure Pt will monitor for next 2 weeks and report back to office. If above 140/90 will start medication to lower BP. Discussed low salt diet.

## 2016-08-01 ENCOUNTER — Other Ambulatory Visit: Payer: Self-pay | Admitting: *Deleted

## 2016-08-01 ENCOUNTER — Telehealth: Payer: Self-pay | Admitting: Physician Assistant

## 2016-08-01 MED ORDER — LISINOPRIL-HYDROCHLOROTHIAZIDE 20-12.5 MG PO TABS: 1.0000 | ORAL_TABLET | Freq: Every day | ORAL | 0 refills | Status: DC

## 2016-08-01 MED FILL — LISINOPRIL-HCTZ 20-12.5 MG: 20-12.5 | 30 days supply | Qty: 30 | Fill #0

## 2016-08-01 NOTE — Telephone Encounter (Signed)
Please send over lisinopril /HCTZ 20/12.5mg  one tablet by mouth daily #30 recheck BP nurse visit in 2-3 weeks.

## 2016-08-01 NOTE — Telephone Encounter (Signed)
Pt notified of rx. 

## 2016-08-01 NOTE — Telephone Encounter (Signed)
Pt called and stated that her BP is not coming down. It was 159/109 and wants to go ahead and be put on BP meds.. Thanks

## 2016-08-09 ENCOUNTER — Encounter (INDEPENDENT_AMBULATORY_CARE_PROVIDER_SITE_OTHER): Payer: Self-pay | Admitting: Family Medicine

## 2016-08-13 ENCOUNTER — Telehealth: Payer: Self-pay | Admitting: Physician Assistant

## 2016-08-13 ENCOUNTER — Ambulatory Visit: Payer: 59

## 2016-08-13 NOTE — Telephone Encounter (Signed)
Call pt: looks great.

## 2016-08-13 NOTE — Telephone Encounter (Signed)
Pt called and stated she took her bp and it was 102/71 and she weighs 195. She cancelled her nurse appointment so she wanted me to give you these numbers. Thanks

## 2016-08-22 ENCOUNTER — Ambulatory Visit (INDEPENDENT_AMBULATORY_CARE_PROVIDER_SITE_OTHER): Payer: Self-pay | Admitting: Family Medicine

## 2016-08-22 MED FILL — ZOLPIDEM TARTRATE 10 MG TAB: 10 | 30 days supply | Qty: 30 | Fill #2

## 2016-08-22 MED FILL — NOW PROBIOTIC-10: 100 days supply | Qty: 100 | Fill #1

## 2016-09-11 ENCOUNTER — Encounter: Payer: Self-pay | Admitting: Physician Assistant

## 2016-09-11 MED ORDER — LISINOPRIL-HYDROCHLOROTHIAZIDE 20-12.5 MG PO TABS: 1.0000 | ORAL_TABLET | Freq: Every day | ORAL | 1 refills | Status: DC

## 2016-09-11 MED FILL — LISINOPRIL-HCTZ 20-12.5 MG: 20-12.5 | 30 days supply | Qty: 30 | Fill #0

## 2016-09-21 MED FILL — ZOLPIDEM TARTRATE 10 MG TAB: 10 | 30 days supply | Qty: 30 | Fill #3

## 2016-10-22 MED FILL — ZOLPIDEM TARTRATE 10 MG TAB: 10 | 30 days supply | Qty: 30 | Fill #0

## 2016-11-09 ENCOUNTER — Ambulatory Visit: Payer: 59 | Admitting: Family Medicine

## 2016-11-09 MED FILL — CEPHALEXIN 500 MG CAPSULE: 500 | 10 days supply | Qty: 30 | Fill #0

## 2016-11-16 MED FILL — CYCLOBENZAPRINE 10 MG TAB: 10 | 10 days supply | Qty: 30 | Fill #1

## 2016-11-19 MED FILL — ZOLPIDEM TARTRATE 10 MG TAB: 10 | 30 days supply | Qty: 30 | Fill #1

## 2016-11-19 MED FILL — LISINOPRIL-HCTZ 20-12.5 MG: 20-12.5 | 30 days supply | Qty: 30 | Fill #1

## 2016-12-05 ENCOUNTER — Other Ambulatory Visit: Payer: Self-pay | Admitting: *Deleted

## 2016-12-05 DIAGNOSIS — R030 Elevated blood-pressure reading, without diagnosis of hypertension: Secondary | ICD-10-CM

## 2016-12-20 MED FILL — ZOLPIDEM TARTRATE 10 MG TAB: 10 | 30 days supply | Qty: 30 | Fill #2

## 2017-01-17 MED FILL — ZOLPIDEM TARTRATE 10 MG TAB: 10 | 30 days supply | Qty: 30 | Fill #3

## 2017-01-29 ENCOUNTER — Ambulatory Visit: Payer: 59 | Admitting: Physician Assistant

## 2017-02-13 ENCOUNTER — Ambulatory Visit (INDEPENDENT_AMBULATORY_CARE_PROVIDER_SITE_OTHER): Payer: 59 | Admitting: Physician Assistant

## 2017-02-13 ENCOUNTER — Encounter: Payer: Self-pay | Admitting: Physician Assistant

## 2017-02-13 VITALS — BP 120/77 | HR 96 | Ht 66.5 in | Wt 189.0 lb

## 2017-02-13 DIAGNOSIS — E6609 Other obesity due to excess calories: Secondary | ICD-10-CM

## 2017-02-13 DIAGNOSIS — F5101 Primary insomnia: Secondary | ICD-10-CM | POA: Diagnosis not present

## 2017-02-13 DIAGNOSIS — R030 Elevated blood-pressure reading, without diagnosis of hypertension: Secondary | ICD-10-CM | POA: Diagnosis not present

## 2017-02-13 DIAGNOSIS — Z6831 Body mass index (BMI) 31.0-31.9, adult: Secondary | ICD-10-CM

## 2017-02-13 MED ORDER — ZOLPIDEM TARTRATE 10 MG PO TABS: 10.0000 mg | ORAL_TABLET | Freq: Every evening | ORAL | 5 refills | Status: DC | PRN

## 2017-02-13 MED ORDER — LIRAGLUTIDE -WEIGHT MANAGEMENT 18 MG/3ML ~~LOC~~ SOPN: 0.6000 mg | PEN_INJECTOR | Freq: Every day | SUBCUTANEOUS | 1 refills | Status: DC

## 2017-02-13 NOTE — Progress Notes (Signed)
   Subjective:    Patient ID: Angela Benton, female    DOB: 21-Dec-1965, 51 y.o.   MRN: [id number]  HPI  Pt is a 51 yo female who presents to the clinic for 6 month follow up.   Obesity- she has lost 6lbs on her own with diet and exercise. She would like to lose 20 more lbs.   Elevated BP reading- she has been off BP medication for over a month. She has been checking bp's once a week and running around 120 over 80.   She continues to use ambien for sleep. She is doing well and controlled. No side effects.   .. Active Ambulatory Problems    Diagnosis Date Noted  . Insomnia 10/29/2011  . Herniated lumbar intervertebral disc 10/29/2011  . Childhood asthma 10/29/2011  . Sickle cell trait (HCC) 06/20/2012  . Vitamin D deficiency 06/24/2012  . Right lumbar radiculitis 02/01/2014  . Dyslipidemia 04/06/2014  . Abnormal weight gain 06/25/2014  . Elevated blood pressure reading 12/23/2014  . Overweight 03/28/2015  . Class 1 obesity due to excess calories without serious comorbidity with body mass index (BMI) of 31.0 to 31.9 in adult 07/30/2016   Resolved Ambulatory Problems    Diagnosis Date Noted  . No Resolved Ambulatory Problems   Past Medical History:  Diagnosis Date  . Herniated lumbar intervertebral disc   . History of chicken pox   . Insomnia   . Sickle cell trait (HCC)       Review of Systems  All other systems reviewed and are negative.      Objective:   Physical Exam  Constitutional: She is oriented to person, place, and time. She appears well-developed and well-nourished.  HENT:  Head: Normocephalic and atraumatic.  Cardiovascular: Normal rate, regular rhythm and normal heart sounds.   Pulmonary/Chest: Effort normal and breath sounds normal.  Neurological: She is alert and oriented to person, place, and time.  Psychiatric: She has a normal mood and affect. Her behavior is normal.          Assessment & Plan:  [name].[name].[name] was seen today for  hypertension.  Diagnoses and all orders for this visit:  Class 1 obesity due to excess calories without serious comorbidity with body mass index (BMI) of 31.0 to 31.9 in adult -     Liraglutide -Weight Management (SAXENDA) 18 MG/3ML SOPN; Inject 0.6 mg into the skin daily. For one week then increase by .6mg  weekly until reaches 3mg  daily.  Please include ultra fine needles 6mm  Primary insomnia -     zolpidem (AMBIEN) 10 MG tablet; Take 1 tablet (10 mg total) by mouth at bedtime as needed for sleep.  Elevated blood pressure reading   Stop BP medication. BP looks great today.   Started saxenda. Coupon card given. Discussed how to titrate up. Discussed side effects. Discussed 1500 calorie diet and 150 minutes of exercise a week. Follow up in 2 months.

## 2017-02-14 ENCOUNTER — Telehealth: Payer: Self-pay | Admitting: *Deleted

## 2017-02-14 ENCOUNTER — Encounter: Payer: Self-pay | Admitting: Physician Assistant

## 2017-02-14 NOTE — Telephone Encounter (Signed)
Pre Authorization sent to cover my meds. [id number])  Rx #: [id number]

## 2017-02-15 NOTE — Telephone Encounter (Signed)
FYI

## 2017-02-18 MED FILL — CYCLOBENZAPRINE 10 MG TAB: 10 | 10 days supply | Qty: 30 | Fill #2

## 2017-02-18 MED FILL — ZOLPIDEM TARTRATE 10 MG TAB: 10 | 30 days supply | Qty: 30 | Fill #0 | Status: TO

## 2017-02-19 ENCOUNTER — Telehealth: Payer: Self-pay | Admitting: Physician Assistant

## 2017-02-19 MED ORDER — PHENTERMINE HCL 37.5 MG PO TABS: 37.5000 mg | ORAL_TABLET | Freq: Every day | ORAL | 0 refills | Status: DC

## 2017-02-19 MED FILL — PHENTERMINE 37.5 MG TABLET: 37.5 | 30 days supply | Qty: 30 | Fill #0

## 2017-02-19 NOTE — Telephone Encounter (Signed)
Let pt know will fax today. We will need to closely monitor bP and pulse. Follow up in 1 month with nurse visit. Most common side effects dry mouth, constipation, anxiety, insomnia, agitiations, palpitations.

## 2017-02-19 NOTE — Telephone Encounter (Signed)
Pharmacy called and will not approve saxenda until phentermine tried. Will send phentermine. Follow up in 1 month nurse visit.

## 2017-02-19 NOTE — Telephone Encounter (Signed)
Pt called to say that the pharmacy will not fill her prescription for Saxenda because she has not tried Phentermine first. She asked if a prescription for Phentermine could be called in for her to see how it might work. Please advise. Thanks!

## 2017-02-21 MED FILL — TECHLITE PEN NDL 32GX1/4: 32G X 6 MM | 90 days supply | Qty: 100 | Fill #0

## 2017-02-21 MED FILL — SAXENDA 18 MG/3 ML PEN: 18 | 30 days supply | Qty: 15 | Fill #0

## 2017-02-21 MED FILL — TECHLITE PEN NDL 32GX1/4": 32G X 6 MM | 90 days supply | Qty: 100 | Fill #0

## 2017-02-25 NOTE — Telephone Encounter (Signed)
Received fax from [employer] and Bernie CoveySaxenda has been approved. The authorization is effective for a maximum of 4 fills from 02/21/17 - 06/22/17. - CF  PA reference number: [id number]

## 2017-03-22 ENCOUNTER — Telehealth: Payer: Self-pay | Admitting: Physician Assistant

## 2017-03-22 ENCOUNTER — Encounter: Payer: Self-pay | Admitting: Physician Assistant

## 2017-03-22 NOTE — Telephone Encounter (Signed)
Pt called to state that she did not respond well to the Saxenda and is requesting that she be put back on Phentermine since it worked much better for her. She needs the new prescription to go the [facility] pharmacy. Thanks!

## 2017-03-22 NOTE — Telephone Encounter (Signed)
With insomnia issues I would not recommend phentermine. She can certainly schedule an office visit to go over strategies for weight loss in addition to her Sexenda. What doses she up to?

## 2017-03-22 NOTE — Telephone Encounter (Signed)
Please advise -EH/RMA  

## 2017-03-25 NOTE — Telephone Encounter (Signed)
Pt notified and stated she will call back and make appointment -EH/RMA

## 2017-03-28 ENCOUNTER — Other Ambulatory Visit: Payer: Self-pay | Admitting: Physician Assistant

## 2017-03-29 ENCOUNTER — Other Ambulatory Visit: Payer: Self-pay

## 2017-03-29 DIAGNOSIS — M5416 Radiculopathy, lumbar region: Secondary | ICD-10-CM

## 2017-03-29 MED ORDER — CYCLOBENZAPRINE HCL 10 MG PO TABS: ORAL_TABLET | ORAL | 0 refills | Status: DC

## 2017-07-01 ENCOUNTER — Encounter: Payer: Self-pay | Admitting: Physician Assistant

## 2017-07-01 ENCOUNTER — Ambulatory Visit (INDEPENDENT_AMBULATORY_CARE_PROVIDER_SITE_OTHER): Payer: 59 | Admitting: Physician Assistant

## 2017-07-01 VITALS — BP 141/84 | HR 75 | Ht 66.5 in | Wt 187.0 lb

## 2017-07-01 DIAGNOSIS — F5101 Primary insomnia: Secondary | ICD-10-CM

## 2017-07-01 DIAGNOSIS — Z6830 Body mass index (BMI) 30.0-30.9, adult: Secondary | ICD-10-CM

## 2017-07-01 DIAGNOSIS — E6609 Other obesity due to excess calories: Secondary | ICD-10-CM

## 2017-07-01 MED ORDER — PHENTERMINE HCL 37.5 MG PO TABS: 37.5000 mg | ORAL_TABLET | Freq: Every day | ORAL | 0 refills | Status: DC

## 2017-07-01 NOTE — Patient Instructions (Addendum)
belviq to consider.

## 2017-07-01 NOTE — Progress Notes (Signed)
   Subjective:    Patient ID: Angela Benton, female    DOB: [birth year], 51 y.o.   MRN: [id number]  HPI  Patient is a 51 year old pleasant female who presents to the clinic to follow-up on obesity.  She was given phentermine for 1 month and to transition to [place].  She tolerated the phentermine great but was unable to tolerate Saxenda.  This made her very sick to her stomach and nauseated.  She never made it past 3.6 mg daily dose.  She is still down 6 pounds from last visit.  Her goal weight is 160.  She admits she is not exercising.  She is trying to watch what she eats.  She feels like her problem consists of her cravings.  She loves sugar.  Insomnia-patient is well controlled on Ambien.  She has no side effects.  .. Active Ambulatory Problems    Diagnosis Date Noted  . Insomnia 10/29/2011  . Herniated lumbar intervertebral disc 10/29/2011  . Childhood asthma 10/29/2011  . Sickle cell trait (HCC) 06/20/2012  . Vitamin D deficiency 06/24/2012  . Right lumbar radiculitis 02/01/2014  . Dyslipidemia 04/06/2014  . Abnormal weight gain 06/25/2014  . Elevated blood pressure reading 12/23/2014  . Overweight 03/28/2015  . Obese 07/30/2016  . Class 1 obesity due to excess calories without serious comorbidity with body mass index (BMI) of 30.0 to 30.9 in adult 07/01/2017   Resolved Ambulatory Problems    Diagnosis Date Noted  . No Resolved Ambulatory Problems   Past Medical History:  Diagnosis Date  . Herniated lumbar intervertebral disc   . History of chicken pox   . Insomnia   . Sickle cell trait (HCC)      Review of Systems  All other systems reviewed and are negative.      Objective:   Physical Exam  Constitutional: She is oriented to person, place, and time. She appears well-developed and well-nourished.  HENT:  Head: Normocephalic and atraumatic.  Cardiovascular: Normal rate, regular rhythm and normal heart sounds.   Pulmonary/Chest: Effort normal and breath sounds  normal. She has no wheezes.  Neurological: She is alert and oriented to person, place, and time.  Psychiatric: She has a normal mood and affect. Her behavior is normal.          Assessment & Plan:  [name].[name].Diagnoses and all orders for this visit:  Class 1 obesity due to excess calories without serious comorbidity with body mass index (BMI) of 30.0 to 30.9 in adult -     phentermine (ADIPEX-P) 37.5 MG tablet; Take 1 tablet (37.5 mg total) by mouth daily before breakfast.  Primary insomnia  .[name].Discussed low carb diet with 1500 calories and 80g of protein.  Exercising at least 150 minutes a week.  My Fitness Pal could be a [employer] resource.  Restarted phentermine. Discussed side effects. Follow up in 1 month for weight check.  Goal weight 160.    Pt is not ready for refill at this time. When she needs one ok for 6 months.

## 2017-07-22 ENCOUNTER — Encounter: Payer: Self-pay | Admitting: Physician Assistant

## 2017-07-22 ENCOUNTER — Other Ambulatory Visit: Payer: Self-pay

## 2017-07-22 DIAGNOSIS — F5101 Primary insomnia: Secondary | ICD-10-CM

## 2017-07-22 MED ORDER — ZOLPIDEM TARTRATE 10 MG PO TABS: 10.0000 mg | ORAL_TABLET | Freq: Every evening | ORAL | 5 refills | Status: DC | PRN

## 2017-07-28 ENCOUNTER — Encounter: Payer: Self-pay | Admitting: Physician Assistant

## 2017-07-28 DIAGNOSIS — M5416 Radiculopathy, lumbar region: Secondary | ICD-10-CM

## 2017-07-29 MED ORDER — CYCLOBENZAPRINE HCL 10 MG PO TABS: ORAL_TABLET | ORAL | 0 refills | Status: DC

## 2017-08-21 ENCOUNTER — Ambulatory Visit (INDEPENDENT_AMBULATORY_CARE_PROVIDER_SITE_OTHER): Payer: 59 | Admitting: Physician Assistant

## 2017-08-21 VITALS — BP 134/81 | HR 83 | Wt 190.0 lb

## 2017-08-21 DIAGNOSIS — R030 Elevated blood-pressure reading, without diagnosis of hypertension: Secondary | ICD-10-CM

## 2017-08-21 NOTE — Progress Notes (Signed)
   Subjective:    Patient ID: Angela Benton, female    DOB: 1965-10-27, 51 y.o.   MRN: [id number]  HPI  Angela Benton is here for a blood pressure check. Denies chest pain, shortness of breath or headaches.   Review of Systems     Objective:   Physical Exam        Assessment & Plan:  Blood pressure check - Second check of blood pressure was within normal limits.

## 2017-08-23 ENCOUNTER — Encounter: Payer: Self-pay | Admitting: Physician Assistant

## 2017-08-27 ENCOUNTER — Emergency Department (HOSPITAL_BASED_OUTPATIENT_CLINIC_OR_DEPARTMENT_OTHER): Payer: 59

## 2017-08-27 ENCOUNTER — Other Ambulatory Visit: Payer: Self-pay

## 2017-08-27 ENCOUNTER — Emergency Department (HOSPITAL_BASED_OUTPATIENT_CLINIC_OR_DEPARTMENT_OTHER)
Admission: EM | Admit: 2017-08-27 | Discharge: 2017-08-27 | Disposition: A | Discharge status: Home / Self Care | Payer: 59 | Attending: Emergency Medicine | Admitting: Emergency Medicine

## 2017-08-27 ENCOUNTER — Encounter (HOSPITAL_BASED_OUTPATIENT_CLINIC_OR_DEPARTMENT_OTHER): Payer: Self-pay | Admitting: Emergency Medicine

## 2017-08-27 DIAGNOSIS — D573 Sickle-cell trait: Secondary | ICD-10-CM | POA: Diagnosis not present

## 2017-08-27 DIAGNOSIS — K839 Disease of biliary tract, unspecified: Secondary | ICD-10-CM | POA: Diagnosis not present

## 2017-08-27 DIAGNOSIS — R1084 Generalized abdominal pain: Secondary | ICD-10-CM | POA: Diagnosis present

## 2017-08-27 DIAGNOSIS — Z79899 Other long term (current) drug therapy: Secondary | ICD-10-CM | POA: Insufficient documentation

## 2017-08-27 DIAGNOSIS — R1011 Right upper quadrant pain: Secondary | ICD-10-CM | POA: Diagnosis not present

## 2017-08-27 DIAGNOSIS — K7689 Other specified diseases of liver: Secondary | ICD-10-CM | POA: Diagnosis not present

## 2017-08-27 DIAGNOSIS — N201 Calculus of ureter: Secondary | ICD-10-CM | POA: Diagnosis not present

## 2017-08-27 DIAGNOSIS — E785 Hyperlipidemia, unspecified: Secondary | ICD-10-CM | POA: Diagnosis not present

## 2017-08-27 DIAGNOSIS — N132 Hydronephrosis with renal and ureteral calculous obstruction: Secondary | ICD-10-CM | POA: Diagnosis not present

## 2017-08-27 DIAGNOSIS — R109 Unspecified abdominal pain: Secondary | ICD-10-CM | POA: Diagnosis not present

## 2017-08-27 DIAGNOSIS — N200 Calculus of kidney: Secondary | ICD-10-CM | POA: Insufficient documentation

## 2017-08-27 DIAGNOSIS — K828 Other specified diseases of gallbladder: Secondary | ICD-10-CM

## 2017-08-27 DIAGNOSIS — K87 Disorders of gallbladder, biliary tract and pancreas in diseases classified elsewhere: Secondary | ICD-10-CM | POA: Diagnosis not present

## 2017-08-27 LAB — URINALYSIS, ROUTINE W REFLEX MICROSCOPIC
Bilirubin Urine: NEGATIVE
Bilirubin Urine: NEGATIVE
GLUCOSE, UA: NEGATIVE mg/dL
GLUCOSE, UA: NEGATIVE mg/dL
Ketones, ur: 15 mg/dL — AB
Ketones, ur: NEGATIVE mg/dL
Nitrite: NEGATIVE
Nitrite: NEGATIVE
PROTEIN: 30 mg/dL — AB
PROTEIN: NEGATIVE mg/dL
Specific Gravity, Urine: 1.02 (ref 1.005–1.030)
pH: 6 (ref 5.0–8.0)
pH: 7.5 (ref 5.0–8.0)

## 2017-08-27 LAB — PREGNANCY, URINE: PREG TEST UR: NEGATIVE

## 2017-08-27 LAB — CBC WITH DIFFERENTIAL/PLATELET
Basophils Absolute: 0 10*3/uL (ref 0.0–0.1)
Basophils Relative: 0 %
EOS PCT: 2 %
Eosinophils Absolute: 0.1 10*3/uL (ref 0.0–0.7)
HEMATOCRIT: 40 % (ref 36.0–46.0)
HEMOGLOBIN: 14.5 g/dL (ref 12.0–15.0)
Lymphocytes Relative: 31 %
Lymphs Abs: 2.1 10*3/uL (ref 0.7–4.0)
MCH: 27 pg (ref 26.0–34.0)
MCHC: 36.3 g/dL — ABNORMAL HIGH (ref 30.0–36.0)
MCV: 74.3 fL — AB (ref 78.0–100.0)
MONOS PCT: 9 %
Monocytes Absolute: 0.6 10*3/uL (ref 0.1–1.0)
NEUTROS PCT: 58 %
Neutro Abs: 4 10*3/uL (ref 1.7–7.7)
Platelets: 233 10*3/uL (ref 150–400)
RBC: 5.38 MIL/uL — AB (ref 3.87–5.11)
RDW: 13.8 % (ref 11.5–15.5)
WBC: 6.8 10*3/uL (ref 4.0–10.5)

## 2017-08-27 LAB — COMPREHENSIVE METABOLIC PANEL
ALK PHOS: 88 U/L (ref 38–126)
ALT: 17 U/L (ref 14–54)
AST: 22 U/L (ref 15–41)
Albumin: 4 g/dL (ref 3.5–5.0)
Anion gap: 6 (ref 5–15)
BILIRUBIN TOTAL: 0.5 mg/dL (ref 0.3–1.2)
BUN: 9 mg/dL (ref 6–20)
CHLORIDE: 104 mmol/L (ref 101–111)
CO2: 26 mmol/L (ref 22–32)
CREATININE: 0.58 mg/dL (ref 0.44–1.00)
Calcium: 9.3 mg/dL (ref 8.9–10.3)
GFR calc Af Amer: >60 mL/min (ref >60)
Glucose, Bld: 94 mg/dL (ref 65–99)
Potassium: 3.9 mmol/L (ref 3.5–5.1)
Sodium: 136 mmol/L (ref 135–145)
TOTAL PROTEIN: 8 g/dL (ref 6.5–8.1)

## 2017-08-27 LAB — LIPASE, BLOOD: LIPASE: 34 U/L (ref 11–51)

## 2017-08-27 LAB — URINALYSIS, MICROSCOPIC (REFLEX)

## 2017-08-27 MED ORDER — TAMSULOSIN HCL 0.4 MG PO CAPS
0.4000 mg | ORAL_CAPSULE | Freq: Every day | ORAL | Status: DC
  Administered 2017-08-27: 0.4 mg via ORAL
  Filled 2017-08-27: qty 1

## 2017-08-27 MED ORDER — KETOROLAC TROMETHAMINE 15 MG/ML IJ SOLN
15.0000 mg | Freq: Once | INTRAMUSCULAR | Status: AC
  Administered 2017-08-27: 15 mg via INTRAVENOUS
  Filled 2017-08-27: qty 1

## 2017-08-27 MED ORDER — ONDANSETRON HCL 4 MG/2ML IJ SOLN
4.0000 mg | Freq: Once | INTRAMUSCULAR | Status: AC
  Administered 2017-08-27: 4 mg via INTRAVENOUS
  Filled 2017-08-27: qty 2

## 2017-08-27 MED ORDER — TAMSULOSIN HCL 0.4 MG PO CAPS: 0.4000 mg | ORAL_CAPSULE | Freq: Every day | ORAL | 0 refills | Status: DC

## 2017-08-27 MED ORDER — MORPHINE SULFATE (PF) 2 MG/ML IV SOLN
2.0000 mg | Freq: Once | INTRAVENOUS | Status: AC
  Administered 2017-08-27: 2 mg via INTRAVENOUS
  Filled 2017-08-27: qty 1

## 2017-08-27 MED ORDER — HYDROCODONE-ACETAMINOPHEN 5-325 MG PO TABS: 1.0000 | ORAL_TABLET | Freq: Four times a day (QID) | ORAL | 0 refills | Status: DC | PRN

## 2017-08-27 MED ORDER — ONDANSETRON 4 MG PO TBDP: 4.0000 mg | ORAL_TABLET | Freq: Four times a day (QID) | ORAL | 0 refills | Status: DC | PRN

## 2017-08-27 MED ORDER — MORPHINE SULFATE (PF) 4 MG/ML IV SOLN
4.0000 mg | Freq: Once | INTRAVENOUS | Status: AC
  Administered 2017-08-27: 4 mg via INTRAVENOUS
  Filled 2017-08-27: qty 1

## 2017-08-27 NOTE — Discharge Instructions (Signed)
Today you received medications that may make you sleepy or impair your ability to make decisions.  For the next 24 hours please do not drive, operate heavy machinery, care for a small child with out another adult present, or perform any activities that may cause harm to you or someone else if you were to fall asleep or be impaired.   You are being prescribed a medication which may make you sleepy. Please follow up of listed precautions for at least 24 hours after taking one dose.  

## 2017-08-27 NOTE — ED Provider Notes (Signed)
[facility] EMERGENCY DEPARTMENT Provider Note   CSN: [id number] Arrival date & time: 08/27/17  1059     History   Chief Complaint Chief Complaint  Patient presents with  . Flank Pain    HPI DAUN RENS is a 51 y.o. female with a history of kidney stones, sickle cell trait, who presents today for evaluation of right sided abdominal pain.  She initially had an episode this morning where she felt a sudden, sharp, right-sided pain that went away after a few minutes.  She reports that shortly prior to arrival she was driving when she had sudden onset of severe, sharp, stabbing, right sided abdominal pain.  She reports that her pain appears to be waxing and waning in nature.  She does endorse mild nausea.  She denies a trauma.  She is not sure if this feels like her previous kidney stone.  She has not been sick recently, and until she had this pain was well.  She is unsure if her pain is directly related to eating, however she does report that she ate Cheerios shortly prior to the pain onset both times.  HPI  Past Medical History:  Diagnosis Date  . Herniated lumbar intervertebral disc    L4 and L5  . History of chicken pox   . Insomnia   . Sickle cell trait [facility])     Patient Active Problem List   Diagnosis Date Noted  . Liver cyst 08/27/2017  . Class 1 obesity due to excess calories without serious comorbidity with body mass index (BMI) of 30.0 to 30.9 in adult 07/01/2017  . Obese 07/30/2016  . Overweight 03/28/2015  . Elevated blood pressure reading 12/23/2014  . Abnormal weight gain 06/25/2014  . Dyslipidemia 04/06/2014  . Right lumbar radiculitis 02/01/2014  . Vitamin D deficiency 06/24/2012  . Sickle cell trait ([place]) 06/20/2012  . Insomnia 10/29/2011  . Herniated lumbar intervertebral disc 10/29/2011  . Childhood asthma 10/29/2011    Past Surgical History:  Procedure Laterality Date  . CESAREAN SECTION      OB History    No data available        Home Medications    Prior to Admission medications   Medication Sig Start Date End Date Taking? Authorizing Provider  cyclobenzaprine (FLEXERIL) 10 MG tablet One half tab PO qHS, then increase gradually to one tab TID. Only use as needed 07/29/17   Breeback, Jade L, PA-C  HYDROcodone-acetaminophen (NORCO/VICODIN) 5-325 MG tablet Take 1-2 tablets by mouth every 6 (six) hours as needed. 08/27/17   Lorin Glass, PA-C  ondansetron (ZOFRAN ODT) 4 MG disintegrating tablet Take 1 tablet (4 mg total) by mouth every 6 (six) hours as needed for nausea or vomiting. 08/27/17   Lorin Glass, PA-C  phentermine (ADIPEX-P) 37.5 MG tablet Take 1 tablet (37.5 mg total) by mouth daily before breakfast. Patient not taking: Reported on 08/21/2017 07/01/17   Donella Stade, PA-C  Probiotic Product (PROBIOTIC DAILY) CAPS One by mouth daily.  May substitute per insurance formulary. 03/29/16   Marcial Pacas, DO  tamsulosin (FLOMAX) 0.4 MG CAPS capsule Take 1 capsule (0.4 mg total) by mouth daily. 08/27/17   Lorin Glass, PA-C  zolpidem (AMBIEN) 10 MG tablet Take 1 tablet (10 mg total) at bedtime as needed by mouth for sleep. 07/22/17   Donella Stade, PA-C    Family History Family History  Problem Relation Age of Onset  . Diabetes Mother   . CAD Mother   .  Cancer Maternal Aunt        Throat    Social History Social History   Tobacco Use  . Smoking status: Never Smoker  . Smokeless tobacco: Never Used  Substance Use Topics  . Alcohol use: Yes    Comment: Rarely  . Drug use: No     Allergies   Saxenda [liraglutide -weight management]   Review of Systems Review of Systems  Constitutional: Negative for chills and fever.  HENT: Negative for ear pain and sore throat.   Eyes: Negative for pain and visual disturbance.  Respiratory: Negative for cough and shortness of breath.   Cardiovascular: Negative for chest pain and palpitations.  Gastrointestinal: Positive for  abdominal pain, nausea and vomiting.  Genitourinary: Positive for vaginal bleeding (Currently menstruating, normal menstrual cycle for patient). Negative for difficulty urinating, dysuria, flank pain, frequency, hematuria, menstrual problem and urgency.  Musculoskeletal: Negative for arthralgias, back pain and neck pain.  Skin: Negative for color change and rash.  Neurological: Negative for seizures, syncope and headaches.  All other systems reviewed and are negative.    Physical Exam Updated Vital Signs BP 124/86   Pulse 74   Temp 98.8 F (37.1 C) (Oral)   Resp 18   Ht 5' 7"  (1.702 m)   Wt 84.8 kg (187 lb)   LMP 08/27/2017   SpO2 97%   BMI 29.29 kg/m   Physical Exam  Constitutional: She is oriented to person, place, and time. She appears well-developed and well-nourished. No distress.  HENT:  Head: Normocephalic and atraumatic.  Eyes: Conjunctivae are normal. Right eye exhibits no discharge. Left eye exhibits no discharge. No scleral icterus.  Neck: Normal range of motion.  Cardiovascular: Normal rate, regular rhythm, normal heart sounds and intact distal pulses.  No murmur heard. Pulmonary/Chest: Effort normal and breath sounds normal. No stridor. No respiratory distress.  Abdominal: Soft. Normal appearance and bowel sounds are normal. She exhibits no distension. There is no hepatosplenomegaly. There is tenderness in the right upper quadrant and right lower quadrant. There is no rigidity, no rebound, no guarding, no CVA tenderness and negative Murphy's sign.  Mild tenderness to palpation in right upper and lower quadrants.  Musculoskeletal: She exhibits no edema or deformity.  Neurological: She is alert and oriented to person, place, and time. She exhibits normal muscle tone.  Skin: Skin is warm and dry. She is not diaphoretic.  Psychiatric: She has a normal mood and affect. Her behavior is normal.  Nursing note and vitals reviewed.    ED Treatments / Results  Labs (all  labs ordered are listed, but only abnormal results are displayed) Labs Reviewed  URINALYSIS, ROUTINE W REFLEX MICROSCOPIC - Abnormal; Notable for the following components:      Result Value   Color, Urine AMBER (*)    APPearance CLOUDY (*)    Specific Gravity, Urine >1.030 (*)    Hgb urine dipstick LARGE (*)    Ketones, ur 15 (*)    Protein, ur 30 (*)    Leukocytes, UA SMALL (*)    All other components within normal limits  URINALYSIS, MICROSCOPIC (REFLEX) - Abnormal; Notable for the following components:   Bacteria, UA MANY (*)    Squamous Epithelial / LPF 6-30 (*)    All other components within normal limits  CBC WITH DIFFERENTIAL/PLATELET - Abnormal; Notable for the following components:   RBC 5.38 (*)    MCV 74.3 (*)    MCHC 36.3 (*)    All other components  within normal limits  URINALYSIS, ROUTINE W REFLEX MICROSCOPIC - Abnormal; Notable for the following components:   Hgb urine dipstick LARGE (*)    Leukocytes, UA TRACE (*)    All other components within normal limits  URINALYSIS, MICROSCOPIC (REFLEX) - Abnormal; Notable for the following components:   Bacteria, UA FEW (*)    Squamous Epithelial / LPF 0-5 (*)    All other components within normal limits  URINE CULTURE  PREGNANCY, URINE  COMPREHENSIVE METABOLIC PANEL  LIPASE, BLOOD    EKG  EKG Interpretation None       Radiology Ct Renal Stone Study  Result Date: 08/27/2017 CLINICAL DATA:  51 year old with right flank pain. History of kidney stones. EXAM: CT ABDOMEN AND PELVIS WITHOUT CONTRAST TECHNIQUE: Multidetector CT imaging of the abdomen and pelvis was performed following the standard protocol without IV contrast. COMPARISON:  08/09/2014 FINDINGS: Lower chest: Mild basilar atelectasis at the lung bases. No pleural effusions. Hepatobiliary: 0.6 cm low-density structure in the lateral left hepatic lobe is probably an incidental finding and could represent a small cyst. High-density material layering in the  gallbladder could represent stones or sludge. No inflammatory changes in this area. No biliary dilatation. Pancreas: Normal appearance of the pancreas without inflammation or duct dilatation. Spleen: Normal appearance of spleen without enlargement. Adrenals/Urinary Tract: Normal adrenal glands. Urinary bladder is unremarkable. Normal appearance of the left kidney without stones or hydronephrosis. Stones in the right kidney lower pole, largest measuring 0.7 cm. Moderate right hydronephrosis. Stranding around the right renal pelvis. Obstructing stone in the proximal right ureter measuring 0.4 cm. Stomach/Bowel: Small hiatal hernia. Normal appendix. No evidence for bowel obstruction or focal bowel inflammation. Vascular/Lymphatic: Again noted are small mesenteric lymph nodes which have minimally changed. No significant lymph node in the abdomen or pelvis. No significant vascular findings. Reproductive: Uterus and bilateral adnexa are unremarkable. Other: No free fluid. Negative for free air. Laxity along the anterior abdominal wall near the umbilicus. Musculoskeletal: Disc space loss at L4-L5 is chronic. Vacuum disc phenomena L4-L5 and L3-L4. Degenerative endplate changes along the inferior endplate of L3 and L4. No acute bone abnormality. IMPRESSION: Moderate right hydronephrosis due to a 0.4 cm stone in the proximal right ureter. Right renal calculi. Cholelithiasis or gallbladder sludge. Electronically Signed   By: Markus Daft M.D.   On: 08/27/2017 12:55    Procedures Procedures (including critical care time)  Medications Ordered in ED Medications  morphine 4 MG/ML injection 4 mg (4 mg Intravenous Given 08/27/17 1220)  ondansetron (ZOFRAN) injection 4 mg (4 mg Intravenous Given 08/27/17 1220)  morphine 2 MG/ML injection 2 mg (2 mg Intravenous Given 08/27/17 1336)  ketorolac (TORADOL) 15 MG/ML injection 15 mg (15 mg Intravenous Given 08/27/17 1426)     Initial Impression / Assessment and Plan / ED Course   I have reviewed the triage vital signs and the nursing notes.  Pertinent labs & imaging results that were available during my care of the patient were reviewed by me and considered in my medical decision making (see chart for details).  Clinical Course as of Aug 27 1933  Tue Aug 27, 2017  1224 Patient has vomited, was just given her Zofran and morphine.  She feels slightly better after Zofran and vomiting.  She stated that she did not do her previous catch as a clean urine catch.  When she is feeling better will probably attempt to re-collection.  [EH]  1331 Patient updated on results, she is resting comfortably with mild  pain.  [EH]    Clinical Course User Index [EH] Lorin Glass, PA-C    JAZLEEN ROBECK presents today for evaluation of sudden onset, severe waxing and waning right-sided abdominal pain.  While she does have a history of kidney stones, she was also having mild right upper quadrant tenderness to palpation, and her pains occurred after she ate food.  Given need to evaluate for both kidney stone and gallbladder pathology a CT renal stone study was ordered.  CT showed gallbladder sludge without obvious localized inflammation, or biliary dilation and an obstructing 0.4 cm stone in the right ureter with moderate hydronephrosis.  Given the lack of acute findings relating to gallbladder sludge I suspect that this is more an incidental finding and that her pain is caused by the obstructing stone in her right ureter combined with hydronephrosis.  Patient was informed of this, along with her incidental findings including possible liver cyst, lumbar changes and hiatal hernia.    Labs obtained with normal ALT/AST, alk phos, and bilirubin supporting gallbladder findings being incidental and not the primary cause of her pain.  Initial urine obtained was not clean catch, obviously contaminated.  Repeat urine after instructions on clean catch were given obtained showed large blood, trace  leukocytes, negative for nitrites or proteins with microscopic showing 0-5 squamous epithelial cells and few bacteria.  I do not feel like this represents a true urinary tract infection or infected stone.    Her symptoms were treated in the emergency room with morphine, Toradol, and Zofran which provided significant symptomatic relief.  She was given prescriptions for Zofran, Vicodin, and Flomax, along with urology follow-up.  She was given strict return precautions, and stated her understanding.  She was instructed that the medicine she received today and prescribed medications may make her drowsy or sleepy and that she should not drive, operate heavy machinery, or perform other potentially dangerous tasks while these medications are in her system.     At this time there does not appear to be any evidence of an acute emergency medical condition and the patient appears stable for discharge with appropriate outpatient follow up.Diagnosis was discussed with patient who verbalizes understanding and is agreeable to discharge. Pt case discussed with Dr. Billy Fischer who agrees with my plan.   Final Clinical Impressions(s) / ED Diagnoses   Final diagnoses:  Right flank pain  Ureterolithiasis  Kidney stone  Liver cyst  Gallbladder sludge    ED Discharge Orders        Ordered    ondansetron (ZOFRAN ODT) 4 MG disintegrating tablet  Every 6 hours PRN     08/27/17 1451    tamsulosin (FLOMAX) 0.4 MG CAPS capsule  Daily     08/27/17 1451    HYDROcodone-acetaminophen (NORCO/VICODIN) 5-325 MG tablet  Every 6 hours PRN     08/27/17 1451       Lorin Glass, [place] 08/27/17 1937    Gareth Morgan, MD 08/29/17 1327

## 2017-08-27 NOTE — ED Notes (Signed)
C/o nausea 

## 2017-08-27 NOTE — ED Triage Notes (Signed)
Patient states that she started to have right sided flank pain at 1030 - noticed some nausea PTA as well

## 2017-08-28 LAB — URINE CULTURE

## 2017-08-28 MED ORDER — PROBIOTIC DAILY PO CAPS: ORAL_CAPSULE | ORAL | 3 refills | Status: DC

## 2017-08-29 ENCOUNTER — Telehealth: Payer: Self-pay | Admitting: Emergency Medicine

## 2017-08-29 NOTE — Telephone Encounter (Signed)
Post ED Visit - Positive Culture Follow-up  Culture report reviewed by antimicrobial stewardship pharmacist:  []  Enzo BiNathan Batchelder, Pharm.D. [x]  Celedonio MiyamotoJeremy Frens, Pharm.D., BCPS AQ-ID []  Garvin FilaMike Maccia, Pharm.D., BCPS []  Georgina PillionElizabeth Martin, Pharm.D., BCPS []  MegargelMinh Pham, [place]D., BCPS, AAHIVP []  Estella HuskMichelle Turner, Pharm.D., BCPS, AAHIVP []  Lysle Pearlachel Rumbarger, PharmD, BCPS []  Casilda Carlsaylor Stone, PharmD, BCPS []  Pollyann SamplesAndy Johnston, PharmD, BCPS  Positive urine culture Treated with none, asymptomatic, no further patient follow-up is required at this time.  Berle MullMiller, Yeudiel Mateo 08/29/2017, 9:49 AM

## 2018-01-20 ENCOUNTER — Encounter: Payer: Self-pay | Admitting: Physician Assistant

## 2018-01-20 ENCOUNTER — Ambulatory Visit (INDEPENDENT_AMBULATORY_CARE_PROVIDER_SITE_OTHER): Payer: 59 | Admitting: Physician Assistant

## 2018-01-20 VITALS — BP 140/82 | HR 78 | Ht 67.0 in | Wt 186.0 lb

## 2018-01-20 DIAGNOSIS — Z0000 Encounter for general adult medical examination without abnormal findings: Secondary | ICD-10-CM | POA: Diagnosis not present

## 2018-01-20 DIAGNOSIS — F5101 Primary insomnia: Secondary | ICD-10-CM

## 2018-01-20 DIAGNOSIS — Z13220 Encounter for screening for lipoid disorders: Secondary | ICD-10-CM | POA: Diagnosis not present

## 2018-01-20 DIAGNOSIS — Z566 Other physical and mental strain related to work: Secondary | ICD-10-CM | POA: Diagnosis not present

## 2018-01-20 DIAGNOSIS — Z131 Encounter for screening for diabetes mellitus: Secondary | ICD-10-CM

## 2018-01-20 DIAGNOSIS — E663 Overweight: Secondary | ICD-10-CM

## 2018-01-20 MED ORDER — BUPROPION HCL ER (XL) 150 MG PO TB24: 150.0000 mg | ORAL_TABLET | ORAL | 2 refills | Status: DC

## 2018-01-20 MED ORDER — ZOLPIDEM TARTRATE 10 MG PO TABS: 10.0000 mg | ORAL_TABLET | Freq: Every evening | ORAL | 5 refills | Status: DC | PRN

## 2018-01-20 NOTE — Patient Instructions (Signed)

## 2018-01-20 NOTE — Progress Notes (Signed)
Subjective:     Angela Benton is a 52 y.o. female and is here for a comprehensive physical exam. The patient reports problems - she continues to struggle with her weight. she would like to lose 20lbs. she is also having some stress at work. she feels a little overwhelmed with her responsibility.  she admits to a lot of stress eating.   Social History   Socioeconomic History  . Marital status: Single    Spouse name: Not on file  . Number of children: 3  . Years of education: Not on file  . Highest education level: Not on file  Occupational History  . Not on file  Social Needs  . Financial resource strain: Not on file  . Food insecurity:    Worry: Not on file    Inability: Not on file  . Transportation needs:    Medical: Not on file    Non-medical: Not on file  Tobacco Use  . Smoking status: Never Smoker  . Smokeless tobacco: Never Used  Substance and Sexual Activity  . Alcohol use: Yes    Comment: Rarely  . Drug use: No  . Sexual activity: Not Currently  Lifestyle  . Physical activity:    Days per week: Not on file    Minutes per session: Not on file  . Stress: Not on file  Relationships  . Social connections:    Talks on phone: Not on file    Gets together: Not on file    Attends religious service: Not on file    Active member of club or organization: Not on file    Attends meetings of clubs or organizations: Not on file    Relationship status: Not on file  . Intimate partner violence:    Fear of current or ex partner: Not on file    Emotionally abused: Not on file    Physically abused: Not on file    Forced sexual activity: Not on file  Other Topics Concern  . Not on file  Social History Narrative  . Not on file   Health Maintenance  Topic Date Due  . PAP SMEAR  11/25/2013  . COLONOSCOPY  10/02/2016  . MAMMOGRAM  06/26/2017  . HIV Screening  01/21/2019 (Originally 10/02/1980)  . INFLUENZA VACCINE  04/03/2018  . TETANUS/TDAP  06/27/2021    The following  portions of the patient's history were reviewed and updated as appropriate: allergies, current medications, past family history, past medical history, past social history, past surgical history and problem list.  Review of Systems Pertinent items noted in HPI and remainder of comprehensive ROS otherwise negative.   Objective:    BP 140/82   Pulse 78   Ht  (1.702 m)   Wt 186 lb (84.4 kg)   BMI 29.13 kg/m  General appearance: alert, cooperative and appears stated age Head: Normocephalic, without obvious abnormality, atraumatic Eyes: conjunctivae/corneas clear. PERRL, EOM's intact. Fundi benign. Ears: normal TM's and external ear canals both ears Nose: Nares normal. Septum midline. Mucosa normal. No drainage or sinus tenderness. Throat: lips, mucosa, and tongue normal; teeth and gums normal Neck: no adenopathy, no carotid bruit, no JVD, supple, symmetrical, trachea midline and thyroid not enlarged, symmetric, no tenderness/mass/nodules Back: symmetric, no curvature. ROM normal. No CVA tenderness. Lungs: clear to auscultation bilaterally Heart: regular rate and rhythm, S1, S2 normal, no murmur, click, rub or gallop Abdomen: soft, non-tender; bowel sounds normal; no masses,  no organomegaly Extremities: extremities normal, atraumatic, no cyanosis  or edema Pulses: 2+ and symmetric Skin: Skin color, texture, turgor normal. No rashes or lesions Lymph nodes: Cervical, supraclavicular, and axillary nodes normal. Neurologic: Alert and oriented X 3, normal strength and tone. Normal symmetric reflexes. Normal coordination and gait    Assessment:    Healthy female exam.      Plan:      Marland KitchenMarland KitchenMylasia was seen today for annual exam.  Diagnoses and all orders for this visit:  Routine physical examination -     Lipid Panel w/reflex Direct LDL -     COMPLETE METABOLIC PANEL WITH GFR -     CBC -     TSH  Stress at work -     buPROPion (WELLBUTRIN XL) 150 MG 24 hr tablet; Take 1 tablet (150  mg total) by mouth every morning.  Overweight (BMI 25.0-29.9) -     TSH  Screening for diabetes mellitus -     COMPLETE METABOLIC PANEL WITH GFR  Screening for lipid disorders -     Lipid Panel w/reflex Direct LDL  Primary insomnia -     zolpidem (AMBIEN) 10 MG tablet; Take 1 tablet (10 mg total) by mouth at bedtime as needed for sleep.    .. Depression screen PHQ 2/9 01/20/2018  Decreased Interest 0  Down, Depressed, Hopeless 2  PHQ - 2 Score 2  Altered sleeping 2  Tired, decreased energy 2  Change in appetite 2  Feeling bad or failure about yourself  1  Trouble concentrating 2  Moving slowly or fidgety/restless 0  Suicidal thoughts 0  PHQ-9 Score 11  Difficult doing work/chores Somewhat difficult   .[name] GAD 7 : Generalized Anxiety Score 01/20/2018  Nervous, Anxious, on Edge 2  Control/stop worrying 0  Worry too much - different things 1  Trouble relaxing 2  Restless 0  Easily annoyed or irritable 3  Afraid - awful might happen 0  Total GAD 7 Score 8  Anxiety Difficulty Somewhat difficult   .[name] Discussed 150 minutes of exercise a week.  Encouraged vitamin D 1000 units and Calcium  or 4 servings of dairy a day.  Reminder to get mammogram. cologuard information signed and ordered.  HO given on shingrix.   Borderline BP. Will continue to monitor. Encouraged to check at home.   For work stress and to help with potential cravings started wellbutrin. Discussed side effects. Discuss other coping mechanisms for stress at work. Follow up in 2 months.  See After Visit Summary for Counseling Recommendations

## 2018-01-21 LAB — CBC
HEMATOCRIT: 42.1 % (ref 35.0–45.0)
Hemoglobin: 14.4 g/dL (ref 11.7–15.5)
MCH: 26.7 pg — AB (ref 27.0–33.0)
MCHC: 34.2 g/dL (ref 32.0–36.0)
MCV: 78 fL — AB (ref 80.0–100.0)
MPV: 11.6 fL (ref 7.5–12.5)
PLATELETS: 232 10*3/uL (ref 140–400)
RBC: 5.4 10*6/uL — ABNORMAL HIGH (ref 3.80–5.10)
RDW: 14.4 % (ref 11.0–15.0)
WBC: 6 10*3/uL (ref 3.8–10.8)

## 2018-01-21 LAB — LIPID PANEL W/REFLEX DIRECT LDL
CHOL/HDL RATIO: 4 (calc) (ref <5.0)
Cholesterol: 155 mg/dL (ref <200)
HDL: 39 mg/dL — ABNORMAL LOW (ref >50)
LDL CHOLESTEROL (CALC): 91 mg/dL
NON-HDL CHOLESTEROL (CALC): 116 mg/dL (ref <130)
TRIGLYCERIDES: 150 mg/dL — AB (ref <150)

## 2018-01-21 LAB — COMPLETE METABOLIC PANEL WITH GFR
AG Ratio: 1.1 (calc) (ref 1.0–2.5)
ALKALINE PHOSPHATASE (APISO): 88 U/L (ref 33–130)
ALT: 12 U/L (ref 6–29)
AST: 14 U/L (ref 10–35)
Albumin: 4.1 g/dL (ref 3.6–5.1)
BILIRUBIN TOTAL: 0.6 mg/dL (ref 0.2–1.2)
BUN: 8 mg/dL (ref 7–25)
CO2: 28 mmol/L (ref 20–32)
Calcium: 9.4 mg/dL (ref 8.6–10.4)
Chloride: 104 mmol/L (ref 98–110)
Creat: 0.69 mg/dL (ref 0.50–1.05)
GFR, Est African American: 117 mL/min/{1.73_m2} (ref >=60)
GFR, Est Non African American: 101 mL/min/{1.73_m2} (ref >=60)
GLUCOSE: 94 mg/dL (ref 65–99)
Globulin: 3.7 g/dL (calc) (ref 1.9–3.7)
POTASSIUM: 3.9 mmol/L (ref 3.5–5.3)
SODIUM: 140 mmol/L (ref 135–146)
Total Protein: 7.8 g/dL (ref 6.1–8.1)

## 2018-01-21 LAB — TSH: TSH: 0.57 m[IU]/L

## 2018-01-22 NOTE — Progress Notes (Signed)
Call pt: LDL is good. TG borderline. Low sugar/carb and exercise can help. Fish oil can also help. Thyroid good. Not anemic. Size of RBC decreased but not concerning. Kidney, liver, glucose look good.

## 2018-02-04 ENCOUNTER — Other Ambulatory Visit: Payer: Self-pay | Admitting: Physician Assistant

## 2018-02-04 DIAGNOSIS — F5101 Primary insomnia: Secondary | ICD-10-CM

## 2018-03-07 ENCOUNTER — Other Ambulatory Visit: Payer: Self-pay | Admitting: Physician Assistant

## 2018-03-07 DIAGNOSIS — F5101 Primary insomnia: Secondary | ICD-10-CM

## 2018-03-07 DIAGNOSIS — M5416 Radiculopathy, lumbar region: Secondary | ICD-10-CM

## 2018-03-21 ENCOUNTER — Ambulatory Visit: Payer: 59 | Admitting: Physician Assistant

## 2018-05-30 ENCOUNTER — Telehealth: Payer: Self-pay | Admitting: Physician Assistant

## 2018-05-30 DIAGNOSIS — M5416 Radiculopathy, lumbar region: Secondary | ICD-10-CM

## 2018-05-30 MED ORDER — CYCLOBENZAPRINE HCL 10 MG PO TABS: ORAL_TABLET | ORAL | 0 refills | Status: DC

## 2018-05-30 NOTE — Telephone Encounter (Signed)
Medication refilled

## 2018-05-30 NOTE — Telephone Encounter (Signed)
Agree 

## 2018-05-30 NOTE — Telephone Encounter (Signed)
Pt called. She hurt her back while moving and wants Flexeril called in to her pharmacy.  Thanks!

## 2018-06-28 ENCOUNTER — Emergency Department (HOSPITAL_BASED_OUTPATIENT_CLINIC_OR_DEPARTMENT_OTHER): Payer: 59

## 2018-06-28 ENCOUNTER — Encounter (HOSPITAL_BASED_OUTPATIENT_CLINIC_OR_DEPARTMENT_OTHER): Payer: Self-pay | Admitting: Emergency Medicine

## 2018-06-28 ENCOUNTER — Other Ambulatory Visit: Payer: Self-pay

## 2018-06-28 ENCOUNTER — Emergency Department (HOSPITAL_BASED_OUTPATIENT_CLINIC_OR_DEPARTMENT_OTHER)
Admission: EM | Admit: 2018-06-28 | Discharge: 2018-06-28 | Disposition: A | Discharge status: Home / Self Care | Payer: 59 | Attending: Emergency Medicine | Admitting: Emergency Medicine

## 2018-06-28 DIAGNOSIS — Z79899 Other long term (current) drug therapy: Secondary | ICD-10-CM | POA: Insufficient documentation

## 2018-06-28 DIAGNOSIS — R0789 Other chest pain: Secondary | ICD-10-CM | POA: Insufficient documentation

## 2018-06-28 DIAGNOSIS — R079 Chest pain, unspecified: Secondary | ICD-10-CM | POA: Diagnosis not present

## 2018-06-28 LAB — BASIC METABOLIC PANEL
Anion gap: 8 (ref 5–15)
BUN: 5 mg/dL — AB (ref 6–20)
CHLORIDE: 105 mmol/L (ref 98–111)
CO2: 26 mmol/L (ref 22–32)
Calcium: 9.1 mg/dL (ref 8.9–10.3)
Creatinine, Ser: 0.52 mg/dL (ref 0.44–1.00)
GFR calc Af Amer: >60 mL/min (ref >60)
GFR calc non Af Amer: >60 mL/min (ref >60)
Glucose, Bld: 101 mg/dL — ABNORMAL HIGH (ref 70–99)
Potassium: 3.3 mmol/L — ABNORMAL LOW (ref 3.5–5.1)
Sodium: 139 mmol/L (ref 135–145)

## 2018-06-28 LAB — TROPONIN I
Troponin I: <0.03 ng/mL (ref <0.03)
Troponin I: <0.03 ng/mL (ref <0.03)

## 2018-06-28 LAB — CBC
HEMATOCRIT: 41.2 % (ref 36.0–46.0)
Hemoglobin: 14.5 g/dL (ref 12.0–15.0)
MCH: 26.8 pg (ref 26.0–34.0)
MCHC: 35.2 g/dL (ref 30.0–36.0)
MCV: 76.2 fL — AB (ref 80.0–100.0)
Platelets: 218 10*3/uL (ref 150–400)
RBC: 5.41 MIL/uL — ABNORMAL HIGH (ref 3.87–5.11)
RDW: 13.5 % (ref 11.5–15.5)
WBC: 7 10*3/uL (ref 4.0–10.5)
nRBC: 0 % (ref 0.0–0.2)

## 2018-06-28 NOTE — ED Triage Notes (Signed)
Pt c/o 7/10 mid chest pain since last night no getting better, no SOB, nausea or vomiting.

## 2018-06-28 NOTE — ED Provider Notes (Signed)
[facility] EMERGENCY DEPARTMENT Provider Note   CSN: [id number] Arrival date & time: 06/28/18  4098     History   Chief Complaint Chief Complaint  Patient presents with  . Chest Pain    HPI Angela Benton is a 52 y.o. female.  Pt presents to the ED today with CP.  She said CP started last night and it was still there this morning.  She said she did not have associated sob, n/v.  She has been under a lot of stress at work.  She denies any cp now.     Past Medical History:  Diagnosis Date  . Herniated lumbar intervertebral disc    L4 and L5  . History of chicken pox   . Insomnia   . Sickle cell trait [facility]     Patient Active Problem List   Diagnosis Date Noted  . Liver cyst 08/27/2017  . Overweight 03/28/2015  . Elevated blood pressure reading 12/23/2014  . Abnormal weight gain 06/25/2014  . Dyslipidemia 04/06/2014  . Right lumbar radiculitis 02/01/2014  . Vitamin D deficiency 06/24/2012  . Sickle cell trait (HCC) 06/20/2012  . Insomnia 10/29/2011  . Herniated lumbar intervertebral disc 10/29/2011  . Childhood asthma 10/29/2011    Past Surgical History:  Procedure Laterality Date  . CESAREAN SECTION       OB History   None      Home Medications    Prior to Admission medications   Medication Sig Start Date End Date Taking? Authorizing Provider  buPROPion (WELLBUTRIN XL) 150 MG 24 hr tablet Take 1 tablet (150 mg total) by mouth every morning. 01/20/18   Breeback, Jade L, PA-C  cyclobenzaprine (FLEXERIL) 10 MG tablet TAKE ONE-HALF TABLET BY MOUTH AT BEDTIME THEN INCREASE GRADUALLY TO TAKE ONE TABLET BY MOUTH THREE TIMES A DAY ONLY USE AS NEEDED 05/30/18   Breeback, Jade L, PA-C  Probiotic Product (PROBIOTIC DAILY) CAPS One by mouth daily.  May substitute per insurance formulary. 08/28/17   Breeback, Jade L, PA-C  zolpidem (AMBIEN) 10 MG tablet TAKE 1 TABLET BY MOUTH AT BEDTIME AS NEEDED FOR SLEEP 03/07/18   Jomarie Longs, PA-C    Family  History Family History  Problem Relation Age of Onset  . Diabetes Mother   . CAD Mother   . Cancer Maternal Aunt        Throat    Social History Social History   Tobacco Use  . Smoking status: Never Smoker  . Smokeless tobacco: Never Used  Substance Use Topics  . Alcohol use: Yes    Comment: Rarely  . Drug use: No     Allergies   Saxenda [liraglutide -weight management]   Review of Systems Review of Systems  Cardiovascular: Positive for chest pain.  All other systems reviewed and are negative.    Physical Exam Updated Vital Signs BP 131/80   Pulse 65   Temp 98.5 F (36.9 C) (Oral)   Resp 20   Ht 5\' 7"  (1.702 m)   Wt 86.2 kg   SpO2 98%   BMI 29.76 kg/m   Physical Exam  Constitutional: She is oriented to person, place, and time. She appears well-developed and well-nourished.  HENT:  Head: Normocephalic and atraumatic.  Eyes: Pupils are equal, round, and reactive to light. EOM are normal.  Neck: Normal range of motion. Neck supple.  Cardiovascular: Normal rate, regular rhythm, intact distal pulses and normal pulses.  Pulmonary/Chest: Effort normal and breath sounds normal.  Abdominal:  Soft. Bowel sounds are normal.  Musculoskeletal: Normal range of motion.       Right lower leg: Normal.       Left lower leg: Normal.  Neurological: She is alert and oriented to person, place, and time.  Skin: Skin is warm and dry. Capillary refill takes less than 2 seconds.  Psychiatric: She has a normal mood and affect. Her behavior is normal.  Nursing note and vitals reviewed.    ED Treatments / Results  Labs (all labs ordered are listed, but only abnormal results are displayed) Labs Reviewed  BASIC METABOLIC PANEL - Abnormal; Notable for the following components:      Result Value   Potassium 3.3 (*)    Glucose, Bld 101 (*)    BUN 5 (*)    All other components within normal limits  CBC - Abnormal; Notable for the following components:   RBC 5.41 (*)    MCV  76.2 (*)    All other components within normal limits  TROPONIN I  TROPONIN I  PREGNANCY, URINE    EKG EKG Interpretation  Date/Time:  Saturday June 28 2018 06:34:18 EDT Ventricular Rate:  79 PR Interval:    QRS Duration: 89 QT Interval:  401 QTC Calculation: 460 R Axis:   40 Text Interpretation:  Sinus rhythm No significant change since last tracing Confirmed by Zadie Rhine ([id number]) on 06/28/2018 6:38:27 AM   Radiology Dg Chest 2 View  Result Date: 06/28/2018 CLINICAL DATA:  Chest pain EXAM: CHEST - 2 VIEW COMPARISON:  July 09, 2016 FINDINGS: The heart size and mediastinal contours are within normal limits. Both lungs are clear. The visualized skeletal structures are unremarkable. IMPRESSION: No active cardiopulmonary disease. Electronically Signed   By: Gerome Sam III M.D   On: 06/28/2018 07:37    Procedures Procedures (including critical care time)  Medications Ordered in ED Medications - No data to display   Initial Impression / Assessment and Plan / ED Course  I have reviewed the triage vital signs and the nursing notes.  Pertinent labs & imaging results that were available during my care of the patient were reviewed by me and considered in my medical decision making (see chart for details).    Heart score 1.  2 negative troponins.  EKG neg.  Pt is stable for d/c.  She is instructed to f/u with pcp.  Final Clinical Impressions(s) / ED Diagnoses   Final diagnoses:  Atypical chest pain    ED Discharge Orders    None       Jacalyn Lefevre, MD 06/28/18 1021

## 2018-07-27 ENCOUNTER — Other Ambulatory Visit: Payer: Self-pay | Admitting: Physician Assistant

## 2018-07-27 DIAGNOSIS — F5101 Primary insomnia: Secondary | ICD-10-CM

## 2018-07-28 ENCOUNTER — Other Ambulatory Visit: Payer: Self-pay | Admitting: Physician Assistant

## 2018-07-28 DIAGNOSIS — F5101 Primary insomnia: Secondary | ICD-10-CM

## 2018-07-28 MED ORDER — ZOLPIDEM TARTRATE 10 MG PO TABS: 10.0000 mg | ORAL_TABLET | Freq: Every evening | ORAL | 1 refills | Status: DC | PRN

## 2018-07-28 NOTE — Telephone Encounter (Signed)
Pt is making an appt for Dec. Needs refill before, routing. Pharmacy on file correct

## 2018-08-11 ENCOUNTER — Ambulatory Visit: Payer: 59 | Admitting: Physician Assistant

## 2018-08-20 ENCOUNTER — Ambulatory Visit (INDEPENDENT_AMBULATORY_CARE_PROVIDER_SITE_OTHER): Payer: 59 | Admitting: Physician Assistant

## 2018-08-20 ENCOUNTER — Encounter: Payer: Self-pay | Admitting: Physician Assistant

## 2018-08-20 VITALS — BP 152/75 | HR 69 | Temp 98.4°F | Ht 67.0 in | Wt 180.0 lb

## 2018-08-20 DIAGNOSIS — R030 Elevated blood-pressure reading, without diagnosis of hypertension: Secondary | ICD-10-CM

## 2018-08-20 DIAGNOSIS — Z566 Other physical and mental strain related to work: Secondary | ICD-10-CM | POA: Diagnosis not present

## 2018-08-20 DIAGNOSIS — R110 Nausea: Secondary | ICD-10-CM

## 2018-08-20 DIAGNOSIS — Z1231 Encounter for screening mammogram for malignant neoplasm of breast: Secondary | ICD-10-CM | POA: Diagnosis not present

## 2018-08-20 DIAGNOSIS — F5101 Primary insomnia: Secondary | ICD-10-CM | POA: Diagnosis not present

## 2018-08-20 MED ORDER — ZOLPIDEM TARTRATE 10 MG PO TABS: 5.0000 mg | ORAL_TABLET | Freq: Every evening | ORAL | 5 refills | Status: DC | PRN

## 2018-08-20 MED ORDER — BUPROPION HCL ER (XL) 150 MG PO TB24: 150.0000 mg | ORAL_TABLET | ORAL | 5 refills | Status: DC

## 2018-08-20 MED ORDER — ONDANSETRON HCL 4 MG PO TABS: 4.0000 mg | ORAL_TABLET | Freq: Three times a day (TID) | ORAL | 0 refills | Status: DC | PRN

## 2018-08-20 NOTE — Progress Notes (Signed)
Subjective:    Patient ID: Angela Benton, female    DOB: 1966-07-02, 52 y.o.   MRN: [id number]  HPI  Patient is a 52 year old female who presents to the clinic with nausea for the last 2 days.  Overall patient just does not feel very well.  She denies any fever, chills, body aches, headache, urinary symptoms, diarrhea.  She has not vomited.  She has no sick contacts.  She feels like her nausea could just be coming from stress.  She is really stressed at work right now.  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 She usually is at work at 5:30.  She denies any suicidal thoughts or homicidal idealizations.  She admits she is drinking a lot of [employer]. She denies any recent CP, palpitations, SOB. She admits she has not been taking wellbutrin daily because she forgets to take.   She needs refill of her ambien.   .. Active Ambulatory Problems    Diagnosis Date Noted  . Insomnia 10/29/2011  . Herniated lumbar intervertebral disc 10/29/2011  . Childhood asthma 10/29/2011  . Sickle cell trait (HCC) 06/20/2012  . Vitamin D deficiency 06/24/2012  . Right lumbar radiculitis 02/01/2014  . Dyslipidemia 04/06/2014  . Abnormal weight gain 06/25/2014  . Elevated blood pressure reading 12/23/2014  . Overweight 03/28/2015  . Liver cyst 08/27/2017   Resolved Ambulatory Problems    Diagnosis Date Noted  . Obese 07/30/2016  . Class 1 obesity due to excess calories without serious comorbidity with body mass index (BMI) of 30.0 to 30.9 in adult 07/01/2017   Past Medical History:  Diagnosis Date  . History of chicken pox      Review of Systems  All other systems reviewed and are negative.      Objective:   Physical Exam Vitals signs reviewed.  Constitutional:      Appearance: Normal appearance.  HENT:     Head: Normocephalic and atraumatic.  Cardiovascular:     Rate and Rhythm: Normal rate and regular rhythm.  Pulmonary:     Effort: Pulmonary effort is  normal.     Breath sounds: Normal breath sounds.  Neurological:     General: No focal deficit present.     Mental Status: She is alert and oriented to person, place, and time.  Psychiatric:        Behavior: Behavior normal.           Assessment & Plan:  Marland KitchenMarland KitchenDiagnoses and all orders for this visit:  Stress at work -     buPROPion (WELLBUTRIN XL) 150 MG 24 hr tablet; Take 1 tablet (150 mg total) by mouth every morning.  Primary insomnia -     zolpidem (AMBIEN) 10 MG tablet; Take 0.5 tablets (5 mg total) by mouth at bedtime as needed. for sleep  Visit for screening mammogram -     MM 3D SCREEN BREAST BILATERAL  Elevated blood pressure reading  Nausea -     ondansetron (ZOFRAN) 4 MG tablet; Take 1 tablet (4 mg total) by mouth every 8 (eight) hours as needed for nausea or vomiting.   No known cause for nausea found. Sent zofran to use as needed.  Discussed I do not see any signs of infection.  If she develops any urinary symptoms, fever, upper respiratory symptoms follow-up.  Patient does appear to be under a lot of stress.  She has been overworked now for a few months.  Went ahead and wrote  her out for 2 days to regroup.  We discussed meditation and relaxation techniques.  Encouraged her to start taking Wellbutrin daily.  Encouraged regular use of exercise in her daily life.  I am concerned that her elevated blood pressure could be causing some symptoms.  The past few times she has had elevated blood pressure.  Patient declines medication today.  I strongly urged her that she needs to start checking her blood pressure for the next month regularly and report back readings to me.  Follow-up in 1 month and we will decide whether or not we need to start medication.  Ambien refilled.  Follow up in 1 month.

## 2018-08-21 ENCOUNTER — Encounter: Payer: Self-pay | Admitting: Physician Assistant

## 2018-08-28 ENCOUNTER — Encounter: Payer: Self-pay | Admitting: Physician Assistant

## 2018-08-28 DIAGNOSIS — F5101 Primary insomnia: Secondary | ICD-10-CM

## 2018-09-01 MED ORDER — ZOLPIDEM TARTRATE 10 MG PO TABS: 10.0000 mg | ORAL_TABLET | Freq: Every evening | ORAL | 5 refills | Status: DC | PRN

## 2018-10-28 DIAGNOSIS — H524 Presbyopia: Secondary | ICD-10-CM | POA: Diagnosis not present

## 2018-10-28 DIAGNOSIS — H5713 Ocular pain, bilateral: Secondary | ICD-10-CM | POA: Diagnosis not present

## 2018-10-29 DIAGNOSIS — H524 Presbyopia: Secondary | ICD-10-CM | POA: Diagnosis not present

## 2018-10-29 DIAGNOSIS — H5713 Ocular pain, bilateral: Secondary | ICD-10-CM | POA: Diagnosis not present

## 2018-12-01 ENCOUNTER — Other Ambulatory Visit: Payer: Self-pay

## 2018-12-01 ENCOUNTER — Other Ambulatory Visit: Payer: Self-pay | Admitting: *Deleted

## 2018-12-01 ENCOUNTER — Other Ambulatory Visit: Payer: Self-pay | Admitting: Cardiology

## 2018-12-01 DIAGNOSIS — N390 Urinary tract infection, site not specified: Secondary | ICD-10-CM

## 2018-12-01 DIAGNOSIS — Z87440 Personal history of urinary (tract) infections: Secondary | ICD-10-CM

## 2018-12-01 DIAGNOSIS — R309 Painful micturition, unspecified: Secondary | ICD-10-CM

## 2018-12-01 MED ORDER — SULFAMETHOXAZOLE-TRIMETHOPRIM 800-160 MG PO TABS: 1.0000 | ORAL_TABLET | Freq: Every day | ORAL | Status: DC

## 2018-12-01 MED ORDER — SULFAMETHOXAZOLE-TRIMETHOPRIM 800-160 MG PO TABS: 1.0000 | ORAL_TABLET | Freq: Two times a day (BID) | ORAL | 0 refills | Status: AC

## 2018-12-01 MED ORDER — SULFAMETHOXAZOLE-TRIMETHOPRIM 800-160 MG PO TABS: 1.0000 | ORAL_TABLET | Freq: Two times a day (BID) | ORAL | Status: DC

## 2018-12-01 NOTE — Progress Notes (Signed)
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

## 2018-12-01 NOTE — Progress Notes (Signed)
uri

## 2018-12-03 LAB — UA/M W/RFLX CULTURE, ROUTINE
Bilirubin, UA: NEGATIVE
Glucose, UA: NEGATIVE
Ketones, UA: NEGATIVE
Nitrite, UA: NEGATIVE
Specific Gravity, UA: 1.022 (ref 1.005–1.030)
Urobilinogen, Ur: 0.2 mg/dL (ref 0.2–1.0)
pH, UA: 5.5 (ref 5.0–7.5)

## 2018-12-03 LAB — MICROSCOPIC EXAMINATION
CASTS: NONE SEEN /LPF
Epithelial Cells (non renal): >10 /hpf — AB (ref 0–10)
RBC, Urine: >30 /hpf — AB (ref 0–2)
WBC, UA: >30 /hpf — AB (ref 0–5)

## 2018-12-03 LAB — URINE CULTURE, REFLEX

## 2018-12-19 ENCOUNTER — Encounter: Payer: Self-pay | Admitting: Physician Assistant

## 2018-12-22 MED ORDER — ALPRAZOLAM 0.25 MG PO TABS: 0.2500 mg | ORAL_TABLET | Freq: Two times a day (BID) | ORAL | 0 refills | Status: DC | PRN

## 2019-01-20 ENCOUNTER — Ambulatory Visit (INDEPENDENT_AMBULATORY_CARE_PROVIDER_SITE_OTHER): Payer: 59 | Admitting: Physician Assistant

## 2019-01-20 ENCOUNTER — Encounter: Payer: Self-pay | Admitting: Physician Assistant

## 2019-01-20 VITALS — BP 148/84 | HR 107 | Ht 67.0 in | Wt 179.0 lb

## 2019-01-20 DIAGNOSIS — F419 Anxiety disorder, unspecified: Secondary | ICD-10-CM

## 2019-01-20 DIAGNOSIS — F5101 Primary insomnia: Secondary | ICD-10-CM

## 2019-01-20 DIAGNOSIS — Z566 Other physical and mental strain related to work: Secondary | ICD-10-CM | POA: Diagnosis not present

## 2019-01-20 DIAGNOSIS — R030 Elevated blood-pressure reading, without diagnosis of hypertension: Secondary | ICD-10-CM | POA: Diagnosis not present

## 2019-01-20 MED ORDER — ZOLPIDEM TARTRATE 10 MG PO TABS: 10.0000 mg | ORAL_TABLET | Freq: Every evening | ORAL | 5 refills | Status: DC | PRN

## 2019-01-20 NOTE — Addendum Note (Signed)
Addended by: Jomarie Longs on: 01/20/2019 03:29 PM   Modules accepted: Orders

## 2019-01-20 NOTE — Progress Notes (Signed)
Patient ID: Angela Benton, female   DOB: [birth year], 53 y.o.   MRN: [id number] .[name].Virtual Visit via Video Note  I connected with Angela Benton on 01/20/19 at  8:10 AM EDT by a video enabled telemedicine application and verified that I am speaking with the correct person using two identifiers.  Location: Patient: work Provider: home   I discussed the limitations of evaluation and management by telemedicine and the availability of in person appointments. The patient expressed understanding and agreed to proceed.  History of Present Illness: Pt is a 53 yo female with insomnia and anxiety who presents to the clinic for medication refill. Pt is doing much better. She is not on wellbutrin or trintellix at this time. She really did not feel like it was helping so she stopped it. She has taken 2 xanax since getting them and doing much better. Her dad died about 1 month ago and put things in perspective for her. She is doing more mediation and not letting work demands get her anxious. She is walking regularly. She continues to use ambien for sleep. She needs refills. She is not checking her BP at home. She admits to coffee and lots of running before virtual visit and vital check.   .. Active Ambulatory Problems    Diagnosis Date Noted  . Insomnia 10/29/2011  . Herniated lumbar intervertebral disc 10/29/2011  . Childhood asthma 10/29/2011  . Sickle cell trait (HCC) 06/20/2012  . Vitamin D deficiency 06/24/2012  . Right lumbar radiculitis 02/01/2014  . Dyslipidemia 04/06/2014  . Abnormal weight gain 06/25/2014  . Elevated blood pressure reading 12/23/2014  . Overweight 03/28/2015  . Liver cyst 08/27/2017  . Stress at work 01/20/2019  . Anxiety 01/20/2019   Resolved Ambulatory Problems    Diagnosis Date Noted  . Obese 07/30/2016  . Class 1 obesity due to excess calories without serious comorbidity with body mass index (BMI) of 30.0 to 30.9 in adult 07/01/2017   Past Medical History:  Diagnosis  Date  . History of chicken pox    Reviewed med, allergy, problem list.     Observations/Objective: No acute distress.  Normal mood.  Normal breathing.   .. Today's Vitals   01/20/19 0816  BP: (!) 148/84  Pulse: (!) 107  SpO2: 97%  Weight: 179 lb (81.2 kg)  Height: 5\' 7"  (1.702 m)   Body mass index is 28.04 kg/m.  .. [facility] screen [facility] 01/20/2019 08/20/2018 01/20/2018  Decreased Interest 1 0 0  Down, Depressed, Hopeless 1 0 2  PHQ - 2 Score 2 0 2  Altered sleeping 0 3 2  Tired, decreased energy 1 2 2   Change in appetite 1 0 2  Feeling bad or failure about yourself  0 3 1  Trouble concentrating 0 1 2  Moving slowly or fidgety/restless 0 0 0  Suicidal thoughts 0 0 0  PHQ-9 Score 4 9 11   Difficult doing work/chores Not difficult at all Not difficult at all Somewhat difficult   .[name]. GAD 7 : Generalized Anxiety Score 01/20/2019 08/20/2018 01/20/2018  Nervous, Anxious, on Edge 0 3 2  Control/stop worrying 1 3 0  Worry too much - different things 0 3 1  Trouble relaxing 1 3 2   Restless 0 3 0  Easily annoyed or irritable 3 3 3   Afraid - awful might happen 0 3 0  Total GAD 7 Score 5 21 8   Anxiety Difficulty Somewhat difficult Somewhat difficult Somewhat difficult     Assessment and  Plan: ..Angela Benton was seen today for depression.  Diagnoses and all orders for this visit:  Anxiety  Primary insomnia  Elevated blood pressure reading  Stress at work    Pt is uncertain about where she wants her [place] sent. She will call back for 6 month refill to pharmacy.   GAD-7 much improved. Does not need refill on xanax at this time. Using sparingly. Continue lifestyle changes.   Needs to check BP for next 2-3 weeks. Goal under 140/90. Looking at hx she has good and bad readings. Discussed lowering salt intake. If remains averaging above 140/90 need to discuss medications.   Needs CPE in the next 3 months.   Follow Up Instructions:    I discussed the assessment and  treatment plan with the patient. The patient was provided an opportunity to ask questions and all were answered. The patient agreed with the plan and demonstrated an understanding of the instructions.   The patient was advised to call back or seek an in-person evaluation if the symptoms worsen or if the condition fails to improve as anticipated.     Tandy Gaw, PA-C

## 2019-01-27 ENCOUNTER — Telehealth: Payer: Self-pay | Admitting: Neurology

## 2019-01-27 NOTE — Telephone Encounter (Signed)
Spoke with patient after receiving cancellation notice of her Cologuard order for incompletion, patient states she does want [place] to reorder for her to have completed.

## 2019-01-27 NOTE — Telephone Encounter (Signed)
Cologuard order completed and faxed to [phone] with confirmation received.

## 2019-07-01 ENCOUNTER — Other Ambulatory Visit: Payer: Self-pay

## 2019-07-01 DIAGNOSIS — F5101 Primary insomnia: Secondary | ICD-10-CM

## 2019-07-01 MED ORDER — ZOLPIDEM TARTRATE 10 MG PO TABS: 10.0000 mg | ORAL_TABLET | Freq: Every evening | ORAL | 0 refills | Status: DC | PRN

## 2019-07-01 NOTE — Telephone Encounter (Signed)
Needs 6 month follow up. Will send rx.

## 2019-07-03 NOTE — Telephone Encounter (Signed)
Patient advised.

## 2019-07-10 ENCOUNTER — Encounter: Payer: Self-pay | Admitting: Physician Assistant

## 2019-07-10 ENCOUNTER — Ambulatory Visit (INDEPENDENT_AMBULATORY_CARE_PROVIDER_SITE_OTHER): Payer: 59 | Admitting: Physician Assistant

## 2019-07-10 VITALS — Temp 97.9°F | Ht 67.0 in | Wt 179.0 lb

## 2019-07-10 DIAGNOSIS — F5101 Primary insomnia: Secondary | ICD-10-CM

## 2019-07-10 DIAGNOSIS — K5900 Constipation, unspecified: Secondary | ICD-10-CM | POA: Insufficient documentation

## 2019-07-10 DIAGNOSIS — Z566 Other physical and mental strain related to work: Secondary | ICD-10-CM | POA: Diagnosis not present

## 2019-07-10 DIAGNOSIS — F419 Anxiety disorder, unspecified: Secondary | ICD-10-CM | POA: Diagnosis not present

## 2019-07-10 MED ORDER — ZOLPIDEM TARTRATE 10 MG PO TABS: 10.0000 mg | ORAL_TABLET | Freq: Every evening | ORAL | 5 refills | Status: DC | PRN

## 2019-07-10 NOTE — Progress Notes (Signed)
Patient ID: Angela Benton, female   DOB: [birth year], 53 y.o.   MRN: [id number] .Marland KitchenVirtual Visit via Video Note  I connected with Angela Benton on 07/10/19 at  9:30 AM EST by a video enabled telemedicine application and verified that I am speaking with the correct person using two identifiers.  Location: Patient: car Provider: clinic   I discussed the limitations of evaluation and management by telemedicine and the availability of in person appointments. The patient expressed understanding and agreed to proceed.  History of Present Illness: Pt is a 53 yo female with insomnia, anxiety, stress at work who calls into the clinic for medication refill.   She is doing really well. Sleeping fine with no problems. Her anxiety and stress are there but [facility] better with working on [employer]. She is walking which helps her mood too. Denies any SI/HC. Rarely taking xanax.   She does have a lot of bloating and gas. She has not looked at food for trigger. She admits to not having bowel movements every day. She does better on probiotic but forgets to take it.   .. Active Ambulatory Problems    Diagnosis Date Noted  . Primary insomnia 10/29/2011  . Herniated lumbar intervertebral disc 10/29/2011  . Childhood asthma 10/29/2011  . Sickle cell trait ([place]) 06/20/2012  . Vitamin D deficiency 06/24/2012  . Right lumbar radiculitis 02/01/2014  . Dyslipidemia 04/06/2014  . Abnormal weight gain 06/25/2014  . Elevated blood pressure reading 12/23/2014  . Overweight 03/28/2015  . Liver cyst 08/27/2017  . Stress at work 01/20/2019  . Anxiety 01/20/2019  . Constipation 07/10/2019   Resolved Ambulatory Problems    Diagnosis Date Noted  . Obese 07/30/2016  . Class 1 obesity due to excess calories without serious comorbidity with body mass index (BMI) of 30.0 to 30.9 in adult 07/01/2017   Past Medical History:  Diagnosis Date  . History of chicken pox   . Insomnia    Reviewed med, allergy, problem  list.     Observations/Objective: No acute distress. Normal appearance and mood.   .. Today's Vitals   07/10/19 0904  Temp: 97.9 F (36.6 C)  TempSrc: Oral  Weight: 179 lb (81.2 kg)  Height: 5\' 7"  (1.702 m)   Body mass index is 28.04 kg/m.   .. Depression screen PHQ 2/9 07/10/2019  Decreased Interest 0  Down, Depressed, Hopeless 0  PHQ - 2 Score 0  Some encounter information is confidential and restricted. Go to Review Flowsheets activity to see all data.   .. GAD 7 : Generalized Anxiety Score 07/10/2019  Nervous, Anxious, on Edge 0  Control/stop worrying 0  Worry too much - different things 0  Trouble relaxing 1  Restless 0  Easily annoyed or irritable 3  Afraid - awful might happen 0  Total GAD 7 Score 4  Anxiety Difficulty Somewhat difficult  Some encounter information is confidential and restricted. Go to Review Flowsheets activity to see all data.     Assessment and Plan: Marland KitchenMarland KitchenDiagnoses and all orders for this visit:  Primary insomnia -     zolpidem (AMBIEN) 10 MG tablet; Take 1 tablet (10 mg total) by mouth at bedtime as needed. for sleep  Constipation, unspecified constipation type  Anxiety  Stress at work   Need CPE at next visit.  Refilled ambien for 6 months.  Discussed anxiety and control with diet, exercise, sleep, CBD oil, lavella, and disconnection from triggers. Pt is doing better.  Encouraged keeping a food  diary for bloating. Take probiotic every day. Stay away from processed foods.   Follow up in 6 months.    Follow Up Instructions:    I discussed the assessment and treatment plan with the patient. The patient was provided an opportunity to ask questions and all were answered. The patient agreed with the plan and demonstrated an understanding of the instructions.   The patient was advised to call back or seek an in-person evaluation if the symptoms worsen or if the condition fails to improve as anticipated.     Tandy Gaw,  PA-C

## 2019-07-10 NOTE — Progress Notes (Signed)
3 month follow up - no issues. PHQ9-GAD7 completed.

## 2019-09-25 ENCOUNTER — Other Ambulatory Visit: Payer: Self-pay

## 2019-09-25 MED ORDER — ALPRAZOLAM 0.25 MG PO TABS: 0.2500 mg | ORAL_TABLET | Freq: Two times a day (BID) | ORAL | 0 refills | Status: DC | PRN

## 2019-11-25 ENCOUNTER — Telehealth: Payer: Self-pay | Admitting: Physician Assistant

## 2019-11-25 DIAGNOSIS — M5416 Radiculopathy, lumbar region: Secondary | ICD-10-CM

## 2019-11-25 MED ORDER — CYCLOBENZAPRINE HCL 10 MG PO TABS: ORAL_TABLET | ORAL | 0 refills | Status: DC

## 2019-11-25 NOTE — Telephone Encounter (Signed)
Pt called and states that she needs a refill on her Flexeril because her back is hurting. Please let patient know when completed. Thank you

## 2019-11-25 NOTE — Telephone Encounter (Signed)
Sent to pharmacy on file, [place] if needs to be sent any where else let me know.

## 2019-12-14 ENCOUNTER — Telehealth (INDEPENDENT_AMBULATORY_CARE_PROVIDER_SITE_OTHER): Payer: 59 | Admitting: Physician Assistant

## 2019-12-14 ENCOUNTER — Encounter: Payer: Self-pay | Admitting: Physician Assistant

## 2019-12-14 VITALS — BP 140/87 | HR 98 | Ht 67.0 in | Wt 189.0 lb

## 2019-12-14 DIAGNOSIS — F5101 Primary insomnia: Secondary | ICD-10-CM | POA: Diagnosis not present

## 2019-12-14 DIAGNOSIS — Z1231 Encounter for screening mammogram for malignant neoplasm of breast: Secondary | ICD-10-CM

## 2019-12-14 DIAGNOSIS — F419 Anxiety disorder, unspecified: Secondary | ICD-10-CM

## 2019-12-14 DIAGNOSIS — Z1211 Encounter for screening for malignant neoplasm of colon: Secondary | ICD-10-CM | POA: Diagnosis not present

## 2019-12-14 DIAGNOSIS — Z566 Other physical and mental strain related to work: Secondary | ICD-10-CM | POA: Diagnosis not present

## 2019-12-14 MED ORDER — ZOLPIDEM TARTRATE 10 MG PO TABS: 10.0000 mg | ORAL_TABLET | Freq: Every evening | ORAL | 5 refills | Status: DC | PRN

## 2019-12-14 MED ORDER — ALPRAZOLAM 0.25 MG PO TABS: 0.2500 mg | ORAL_TABLET | Freq: Two times a day (BID) | ORAL | 1 refills | Status: DC | PRN

## 2019-12-14 NOTE — Progress Notes (Signed)
Patient ID: Angela Benton, female   DOB: 07-16-1966, 54 y.o.   MRN: [id number]

## 2019-12-14 NOTE — Progress Notes (Signed)
Patient ID: Angela Benton, female   DOB: Feb 17, 1966, 54 y.o.   MRN: [id number] .Virtual Visit via Telephone Note  I connected with Angela Benton on 12/14/19 at  8:30 AM EDT by telephone and verified that I am speaking with the correct person using two identifiers.  Location: Patient: work Provider: clinic   I discussed the limitations, risks, security and privacy concerns of performing an evaluation and management service by telephone and the availability of in person appointments. I also discussed with the patient that there may be a patient responsible charge related to this service. The patient expressed understanding and agreed to proceed.   History of Present Illness: Patient is a 54 year old female with insomnia and anxiety who calls into the clinic for medication refills.  She is taking Ambien nightly with no problems or concerns.  She is able to sleep well when she wakes up feeling rested.  She needs refills today.  Patient has intermittent anxiety and stress.  She most of her stress is related to work.  She uses Xanax intermittently.  She does need a refill today.  She has tried daily anxiety medicine in the past.  And she just does not want to go that route right now.  She does not use the Xanax very frequently.  She does mention she is having more problem with constipation recently.  Her stools are less frequent and harder.  She has seen gastroenterology and they have given her some MiraLAX formula to help her have better bowel movements.  She is using probiotics daily.    .. Active Ambulatory Problems    Diagnosis Date Noted  . Primary insomnia 10/29/2011  . Herniated lumbar intervertebral disc 10/29/2011  . Childhood asthma 10/29/2011  . Sickle cell trait ([place]) 06/20/2012  . Vitamin D deficiency 06/24/2012  . Right lumbar radiculitis 02/01/2014  . Dyslipidemia 04/06/2014  . Abnormal weight gain 06/25/2014  . Elevated blood pressure reading 12/23/2014  . Overweight 03/28/2015   . Liver cyst 08/27/2017  . Stress at work 01/20/2019  . Anxiety 01/20/2019  . Constipation 07/10/2019   Resolved Ambulatory Problems    Diagnosis Date Noted  . Obese 07/30/2016  . Class 1 obesity due to excess calories without serious comorbidity with body mass index (BMI) of 30.0 to 30.9 in adult 07/01/2017   Past Medical History:  Diagnosis Date  . History of chicken pox   . Insomnia    Reviewed med, allergy, problem list.    Observations/Objective: No acute distress Normal mood.   .. Today's Vitals   12/14/19 0828  BP: 140/87  Pulse: 98  Weight: 189 lb (85.7 kg)  Height: 5\' 7"  (1.702 m)   Body mass index is 29.6 kg/m.  .. Depression screen PHQ 2/9 07/10/2019  Decreased Interest 0  Down, Depressed, Hopeless 0  PHQ - 2 Score 0  Some encounter information is confidential and restricted. Go to Review Flowsheets activity to see all data.   .. GAD 7 : Generalized Anxiety Score 07/10/2019  Nervous, Anxious, on Edge 0  Control/stop worrying 0  Worry too much - different things 0  Trouble relaxing 1  Restless 0  Easily annoyed or irritable 3  Afraid - awful might happen 0  Total GAD 7 Score 4  Anxiety Difficulty Somewhat difficult  Some encounter information is confidential and restricted. Go to Review Flowsheets activity to see all data.       Assessment and Plan: Marland KitchenMarland KitchenDiagnoses and all orders for this visit:  Primary  insomnia -     zolpidem (AMBIEN) 10 MG tablet; Take 1 tablet (10 mg total) by mouth at bedtime as needed. for sleep  Visit for screening mammogram -     MM 3D SCREEN BREAST BILATERAL  Colon cancer screening -     Cancel: Cologuard -     Cologuard  Anxiety -     ALPRAZolam (XANAX) 0.25 MG tablet; Take 1 tablet (0.25 mg total) by mouth 2 (two) times daily as needed for anxiety.  Stress at work -     ALPRAZolam Prudy Feeler) 0.25 MG tablet; Take 1 tablet (0.25 mg total) by mouth 2 (two) times daily as needed for anxiety.  Ambien refilled for 6  months.  Discussed stress and anxiety and ways to regulate this.  Xanax refilled for as needed sparing usage.  Patient is in need of some health maintenance.  She declines colonoscopy.  Cologuard was ordered and explained how to use this.  She is aware if Cologuard is positive then a colonoscopy will be highly recommended.  Her mammogram was ordered today.  Patient declines Pap today.  She has not been sexually active in 16 months.  Explained she still does need regular Pap smears.  She will get this done at the end of the year or at next follow-up.    Follow Up Instructions:    I discussed the assessment and treatment plan with the patient. The patient was provided an opportunity to ask questions and all were answered. The patient agreed with the plan and demonstrated an understanding of the instructions.   The patient was advised to call back or seek an in-person evaluation if the symptoms worsen or if the condition fails to improve as anticipated.  I provided 11 minutes of non-face-to-face time during this encounter.   Tandy Gaw, PA-C

## 2019-12-16 ENCOUNTER — Other Ambulatory Visit: Payer: Self-pay

## 2019-12-16 ENCOUNTER — Ambulatory Visit (INDEPENDENT_AMBULATORY_CARE_PROVIDER_SITE_OTHER): Payer: 59

## 2019-12-16 DIAGNOSIS — Z1231 Encounter for screening mammogram for malignant neoplasm of breast: Secondary | ICD-10-CM | POA: Diagnosis not present

## 2019-12-16 NOTE — Progress Notes (Signed)
Normal mammogram. Follow up in 1 year.

## 2020-05-10 ENCOUNTER — Telehealth: Payer: Self-pay

## 2020-05-10 MED ORDER — PHENTERMINE HCL 37.5 MG PO TABS: 37.5000 mg | ORAL_TABLET | Freq: Every day | ORAL | 0 refills | Status: DC

## 2020-05-10 NOTE — Telephone Encounter (Signed)
Sent to [facility]. Follow up in 1 month.

## 2020-05-10 NOTE — Telephone Encounter (Signed)
Angela Benton would like to start a weight loss medication, phentermine. Weight is 196 lbs, Height is 67 in and blood pressure is 120/85.

## 2020-05-11 NOTE — Telephone Encounter (Signed)
Sent message to patient

## 2020-05-17 MED FILL — FLUARIX QUADRIVALENT 0.5 ML: 0.5 | 1 days supply | Qty: 1 | Fill #0

## 2020-06-14 ENCOUNTER — Other Ambulatory Visit: Payer: Self-pay

## 2020-06-14 DIAGNOSIS — M5416 Radiculopathy, lumbar region: Secondary | ICD-10-CM

## 2020-06-14 MED ORDER — CYCLOBENZAPRINE HCL 10 MG PO TABS: ORAL_TABLET | ORAL | 0 refills | Status: DC

## 2020-06-22 ENCOUNTER — Other Ambulatory Visit: Payer: Self-pay | Admitting: Physician Assistant

## 2020-06-22 DIAGNOSIS — F5101 Primary insomnia: Secondary | ICD-10-CM

## 2020-06-23 ENCOUNTER — Other Ambulatory Visit: Payer: Self-pay

## 2020-06-23 DIAGNOSIS — F5101 Primary insomnia: Secondary | ICD-10-CM

## 2020-06-23 MED ORDER — ZOLPIDEM TARTRATE 10 MG PO TABS: 10.0000 mg | ORAL_TABLET | Freq: Every evening | ORAL | 0 refills | Status: DC | PRN

## 2020-06-23 NOTE — Telephone Encounter (Signed)
Requesting RF on Ambien  Is making follow up appt with PCP, currently completely out of meds.   RX pended, can you send 1 month supply?

## 2020-06-23 NOTE — Telephone Encounter (Signed)
Last written 12/14/2019 #30 with 5 refills Last appt 12/14/2019

## 2020-06-27 ENCOUNTER — Encounter: Payer: Self-pay | Admitting: Physician Assistant

## 2020-06-27 ENCOUNTER — Telehealth (INDEPENDENT_AMBULATORY_CARE_PROVIDER_SITE_OTHER): Payer: 59 | Admitting: Physician Assistant

## 2020-06-27 VITALS — BP 124/78 | HR 82 | Ht 67.0 in | Wt 188.0 lb

## 2020-06-27 DIAGNOSIS — R1030 Lower abdominal pain, unspecified: Secondary | ICD-10-CM | POA: Diagnosis not present

## 2020-06-27 DIAGNOSIS — E663 Overweight: Secondary | ICD-10-CM

## 2020-06-27 DIAGNOSIS — K5900 Constipation, unspecified: Secondary | ICD-10-CM

## 2020-06-27 DIAGNOSIS — R140 Abdominal distension (gaseous): Secondary | ICD-10-CM | POA: Insufficient documentation

## 2020-06-27 DIAGNOSIS — F5101 Primary insomnia: Secondary | ICD-10-CM | POA: Diagnosis not present

## 2020-06-27 MED ORDER — PHENTERMINE HCL 37.5 MG PO TABS: 37.5000 mg | ORAL_TABLET | Freq: Every day | ORAL | 0 refills | Status: DC

## 2020-06-27 NOTE — Progress Notes (Signed)
Patient ID: Angela Benton, female   DOB: [birth year], 54 y.o.   MRN: [id number] .Marland KitchenVirtual Visit via Telephone Note  I connected with Angela Benton on 06/27/2020 at  1:20 PM EDT by telephone and verified that I am speaking with the correct person using two identifiers.  Location: Patient: work Provider: clinic   I discussed the limitations, risks, security and privacy concerns of performing an evaluation and management service by telephone and the availability of in person appointments. I also discussed with the patient that there may be a patient responsible charge related to this service. The patient expressed understanding and agreed to proceed.   History of Present Illness: Patient is a 54 year old overweight female who presents to the clinic for medication follow-up.  Patient was started on phentermine last month.  She has tolerated well.  She has no adverse events concerns or complaints.  She has only lost 1 pound.  She would like to stay on it for another month.  She is walking a lot with work and getting at least 10,000 steps.  She continues to eat more than she should and make bad food choices.  Insomnia is well controlled with Ambien.  She has been on this for quite a while.  No complaints.  She does feel like she is more bloated ever.  She admits to long history of constipation.  When she does take probiotics it seems to help her have at least one good bowel movement a day.  She struggles to remember to take probiotics.  She is not aware of any food triggers.  She does have some lower abdominal pain from time to time.  She hates eating because of the bloating. No family history of ovarian cancer. Pt continues to have her period monthly.    .. Active Ambulatory Problems    Diagnosis Date Noted  . Primary insomnia 10/29/2011  . Herniated lumbar intervertebral disc 10/29/2011  . Childhood asthma 10/29/2011  . Sickle cell trait (HCC) 06/20/2012  . Vitamin D deficiency 06/24/2012  .  Right lumbar radiculitis 02/01/2014  . Dyslipidemia 04/06/2014  . Abnormal weight gain 06/25/2014  . Elevated blood pressure reading 12/23/2014  . Overweight 03/28/2015  . Liver cyst 08/27/2017  . Stress at work 01/20/2019  . Anxiety 01/20/2019  . Constipation 07/10/2019  . Bloating 06/27/2020  . Lower abdominal pain 06/28/2020   Resolved Ambulatory Problems    Diagnosis Date Noted  . Obese 07/30/2016  . Class 1 obesity due to excess calories without serious comorbidity with body mass index (BMI) of 30.0 to 30.9 in adult 07/01/2017   Past Medical History:  Diagnosis Date  . History of chicken pox   . Insomnia    Reviewed med, allergy, problem list.  Observations/Objective:  NO acute distress Normal mood.   .. Today's Vitals   06/27/20 1104  BP: 124/78  Pulse: 82  Weight: 188 lb (85.3 kg)  Height: 5\' 7"  (1.702 m)   Body mass index is 29.44 kg/m.    Assessment and Plan: Marland KitchenAnnistyn was seen today for follow-up.  Diagnoses and all orders for this visit:  Overweight -     phentermine (ADIPEX-P) 37.5 MG tablet; Take 1 tablet (37.5 mg total) by mouth daily before breakfast.  Bloating -     [name] Pelvic Complete With Transvaginal  Constipation, unspecified constipation type -     US Pelvic Complete With Transvaginal  Lower abdominal pain -     US Pelvic Complete With Transvaginal  Primary insomnia -  zolpidem (AMBIEN) 10 MG tablet; Take 1 tablet (10 mg total) by mouth at bedtime as needed. for sleep   Continue to work on weight loss. Only lost 1 lbs so if in one more month no significant change will stop phentermine. Encouraged daily walking and working on portion sizes and cravings.   Insomnia controlled with ambien.   Pt has hx of constipation. Improved with probiotics. Struggles to remember! Take in the morning with phentermine. Phentermine can make constipation worse. If cannot control constipation need to consider other options. Mentions bloating. Would  like to get pelvic U/S to confirm no ovarian pathology. She is still having periods.  Keep a food journey to identify food triggers than you can learn to avoid. Drink plenty of water.  Follow up in 1 month or sooner to discuss weight.   Follow Up Instructions:    I discussed the assessment and treatment plan with the patient. The patient was provided an opportunity to ask questions and all were answered. The patient agreed with the plan and demonstrated an understanding of the instructions.   The patient was advised to call back or seek an in-person evaluation if the symptoms worsen or if the condition fails to improve as anticipated.  I provided 15 minutes of non-face-to-face time during this encounter.   Tandy Gaw, PA-C

## 2020-06-28 DIAGNOSIS — R1030 Lower abdominal pain, unspecified: Secondary | ICD-10-CM | POA: Insufficient documentation

## 2020-06-28 MED ORDER — ZOLPIDEM TARTRATE 10 MG PO TABS: 10.0000 mg | ORAL_TABLET | Freq: Every evening | ORAL | 5 refills | Status: DC | PRN

## 2020-06-30 ENCOUNTER — Telehealth: Payer: Self-pay | Admitting: Physician Assistant

## 2020-06-30 NOTE — Telephone Encounter (Signed)
FYI: Spoke with pt today.  She will call to schedule her follow up visit to 10/25.. Thank you.

## 2020-07-02 ENCOUNTER — Encounter (HOSPITAL_BASED_OUTPATIENT_CLINIC_OR_DEPARTMENT_OTHER): Payer: Self-pay

## 2020-07-02 ENCOUNTER — Ambulatory Visit (HOSPITAL_BASED_OUTPATIENT_CLINIC_OR_DEPARTMENT_OTHER): Admission: RE | Admit: 2020-07-02 | Payer: 59 | Source: Ambulatory Visit

## 2020-07-02 ENCOUNTER — Other Ambulatory Visit: Payer: Self-pay

## 2020-07-02 ENCOUNTER — Other Ambulatory Visit: Payer: Self-pay | Admitting: Physician Assistant

## 2020-07-02 DIAGNOSIS — R1030 Lower abdominal pain, unspecified: Secondary | ICD-10-CM

## 2020-07-02 DIAGNOSIS — R140 Abdominal distension (gaseous): Secondary | ICD-10-CM

## 2020-07-02 DIAGNOSIS — K5900 Constipation, unspecified: Secondary | ICD-10-CM

## 2020-07-03 ENCOUNTER — Inpatient Hospital Stay (HOSPITAL_BASED_OUTPATIENT_CLINIC_OR_DEPARTMENT_OTHER): Admission: RE | Admit: 2020-07-03 | Payer: 59 | Source: Ambulatory Visit

## 2020-07-18 ENCOUNTER — Ambulatory Visit: Payer: 59 | Attending: Internal Medicine

## 2020-07-18 ENCOUNTER — Other Ambulatory Visit (HOSPITAL_BASED_OUTPATIENT_CLINIC_OR_DEPARTMENT_OTHER): Payer: Self-pay | Admitting: Internal Medicine

## 2020-07-18 DIAGNOSIS — Z23 Encounter for immunization: Secondary | ICD-10-CM

## 2020-07-18 NOTE — Progress Notes (Signed)
° °  Covid-19 Vaccination Clinic  Name:  Angela Benton    MRN: [id number] DOB: June 02, 1966  07/18/2020  [name] was observed post Covid-19 immunization for 15 minutes without incident. She was provided with Vaccine Information Sheet and instruction to access the V-Safe system.   [name] was instructed to call 911 with any severe reactions post vaccine:  Difficulty breathing   Swelling of face and throat   A fast heartbeat   A bad rash all over body   Dizziness and weakness   Immunizations Administered    No immunizations on file.

## 2020-07-19 MED FILL — MODERNA COVID-19 VACCINE 10: 100 | 1 days supply | Qty: 0 | Fill #0

## 2020-08-01 ENCOUNTER — Other Ambulatory Visit: Payer: Self-pay | Admitting: Physician Assistant

## 2020-08-01 DIAGNOSIS — Z566 Other physical and mental strain related to work: Secondary | ICD-10-CM

## 2020-08-01 DIAGNOSIS — F419 Anxiety disorder, unspecified: Secondary | ICD-10-CM

## 2020-08-01 MED ORDER — ALPRAZOLAM 0.25 MG PO TABS: 0.2500 mg | ORAL_TABLET | Freq: Two times a day (BID) | ORAL | 1 refills | Status: DC | PRN

## 2020-08-01 NOTE — Telephone Encounter (Signed)
Sent!

## 2020-08-01 NOTE — Telephone Encounter (Signed)
Last written 12/14/2019 #30 with 1 refill. Pended.

## 2020-08-01 NOTE — Telephone Encounter (Signed)
Can you call in Angela Benton's ALPRAZolam Prudy Feeler) 0.25 MG tablet and let her know.

## 2020-08-08 ENCOUNTER — Ambulatory Visit: Payer: 59 | Admitting: Physician Assistant

## 2020-08-08 ENCOUNTER — Ambulatory Visit (INDEPENDENT_AMBULATORY_CARE_PROVIDER_SITE_OTHER): Payer: 59 | Admitting: Physician Assistant

## 2020-08-08 DIAGNOSIS — Z5329 Procedure and treatment not carried out because of patient's decision for other reasons: Secondary | ICD-10-CM

## 2020-08-08 NOTE — Progress Notes (Signed)
No show

## 2020-09-04 ENCOUNTER — Other Ambulatory Visit: Payer: Self-pay | Admitting: Physician Assistant

## 2020-09-04 DIAGNOSIS — F419 Anxiety disorder, unspecified: Secondary | ICD-10-CM

## 2020-09-04 DIAGNOSIS — Z566 Other physical and mental strain related to work: Secondary | ICD-10-CM

## 2020-09-23 ENCOUNTER — Telehealth: Payer: Self-pay

## 2020-09-23 DIAGNOSIS — F439 Reaction to severe stress, unspecified: Secondary | ICD-10-CM

## 2020-09-23 NOTE — Telephone Encounter (Signed)
Bre would like a referral for therapy due to stress. Referral placed.

## 2020-09-23 NOTE — Telephone Encounter (Signed)
Agree with plan 

## 2020-09-30 ENCOUNTER — Ambulatory Visit (INDEPENDENT_AMBULATORY_CARE_PROVIDER_SITE_OTHER): Payer: 59 | Admitting: Psychology

## 2020-09-30 DIAGNOSIS — F4322 Adjustment disorder with anxiety: Secondary | ICD-10-CM

## 2020-10-12 ENCOUNTER — Ambulatory Visit: Payer: 59 | Admitting: Psychology

## 2020-11-16 ENCOUNTER — Other Ambulatory Visit: Payer: Self-pay | Admitting: Physician Assistant

## 2020-11-16 DIAGNOSIS — Z566 Other physical and mental strain related to work: Secondary | ICD-10-CM

## 2020-11-16 DIAGNOSIS — F419 Anxiety disorder, unspecified: Secondary | ICD-10-CM

## 2020-11-16 NOTE — Telephone Encounter (Signed)
LVM for patient to call back to get appt scheduled. AM 

## 2020-11-16 NOTE — Telephone Encounter (Signed)
Needs follow up. I will send refill.

## 2020-11-16 NOTE — Telephone Encounter (Signed)
Written 09/07/2020 - #30 with one refill Last appt 06/27/2020

## 2020-11-16 NOTE — Telephone Encounter (Signed)
Please call patient to schedule appt 

## 2020-11-21 ENCOUNTER — Telehealth (INDEPENDENT_AMBULATORY_CARE_PROVIDER_SITE_OTHER): Payer: 59 | Admitting: Physician Assistant

## 2020-11-21 ENCOUNTER — Encounter: Payer: Self-pay | Admitting: Physician Assistant

## 2020-11-21 VITALS — BP 148/99

## 2020-11-21 DIAGNOSIS — Z1211 Encounter for screening for malignant neoplasm of colon: Secondary | ICD-10-CM | POA: Diagnosis not present

## 2020-11-21 DIAGNOSIS — R030 Elevated blood-pressure reading, without diagnosis of hypertension: Secondary | ICD-10-CM

## 2020-11-21 DIAGNOSIS — F419 Anxiety disorder, unspecified: Secondary | ICD-10-CM | POA: Diagnosis not present

## 2020-11-21 DIAGNOSIS — Z566 Other physical and mental strain related to work: Secondary | ICD-10-CM

## 2020-11-21 DIAGNOSIS — F5101 Primary insomnia: Secondary | ICD-10-CM

## 2020-11-21 MED ORDER — ZOLPIDEM TARTRATE 10 MG PO TABS: 10.0000 mg | ORAL_TABLET | Freq: Every evening | ORAL | 5 refills | Status: DC | PRN

## 2020-11-21 NOTE — Progress Notes (Signed)
Patient ID: Angela Benton, female   DOB: 06/26/1966, 55 y.o.   MRN: [id number] .Marland KitchenVirtual Visit via Telephone Note  I connected with Angela Benton on 11/21/2020 at 10:30 AM EDT by telephone and verified that I am speaking with the correct person using two identifiers.  Location: Patient: home Provider: clinic  .Marland KitchenParticipating in visit:  Patient: Angela Benton Provider: Iran Planas PA-C   I discussed the limitations, risks, security and privacy concerns of performing an evaluation and management service by telephone and the availability of in person appointments. I also discussed with the patient that there may be a patient responsible charge related to this service. The patient expressed understanding and agreed to proceed.   History of Present Illness:  Pt is a 55 yo female with insomnia who presents to the clinic to discuss more anxiety and stress at work. She is in a very toxic work situation and feeling a little stuck. She has wanted to make it work but does not look like it is going to work. She is very frustrated. She hates going into work. She is going to counseling but does not feel like it is helping. She is taking xanax as needed but finding she is needing it more and more. Her sleep is ok with [place]. No SI/HC.   Not checking BP. No CP, palpitations, headaches or vision changes.    Active Ambulatory Problems    Diagnosis Date Noted  . Primary insomnia 10/29/2011  . Herniated lumbar intervertebral disc 10/29/2011  . Childhood asthma 10/29/2011  . Sickle cell trait ([facility]) 06/20/2012  . Vitamin D deficiency 06/24/2012  . Right lumbar radiculitis 02/01/2014  . Dyslipidemia 04/06/2014  . Abnormal weight gain 06/25/2014  . Elevated blood pressure reading 12/23/2014  . Overweight 03/28/2015  . Liver cyst 08/27/2017  . Stress at work 01/20/2019  . Anxiety 01/20/2019  . Constipation 07/10/2019  . Bloating 06/27/2020  . Lower abdominal pain 06/28/2020   Resolved Ambulatory Problems     Diagnosis Date Noted  . Obese 07/30/2016  . Class 1 obesity due to excess calories without serious comorbidity with body mass index (BMI) of 30.0 to 30.9 in adult 07/01/2017   Past Medical History:  Diagnosis Date  . History of chicken pox   . Insomnia      Observations/Objective: No acute distress  Normal mood No labored breathing  .[name] Today's Vitals   11/21/20 1036 11/21/20 1258  BP: (!) 170/98 (!) 148/99   There is no height or weight on file to calculate BMI.  .. GAD 7 : Generalized Anxiety Score 11/22/2020 07/10/2019  Nervous, Anxious, on Edge 3 0  Control/stop worrying 3 0  Worry too much - different things 3 0  Trouble relaxing 3 1  Restless 3 0  Easily annoyed or irritable 3 3  Afraid - awful might happen 3 0  Total GAD 7 Score 21 4  Anxiety Difficulty Very difficult Somewhat difficult  Some encounter information is confidential and restricted. Go to Review Flowsheets activity to see all data.      Assessment and Plan: Marland KitchenMarland KitchenSeptember was seen today for anxiety.  Diagnoses and all orders for this visit:  Stress at work  Primary insomnia -     zolpidem (AMBIEN) 10 MG tablet; Take 1 tablet (10 mg total) by mouth at bedtime as needed. for sleep  Anxiety  Colon cancer screening -     Cologuard  Elevated blood pressure reading   Discussed need for medication during difficult time. Continues  to decline. Continue with counseling and reframing. Discussed some time off to decide what changes she needs to make. Take one week in April at least and then long weekends.  She is the only one who has the power to change herself. Discussed not taking xanax regularly and that is not the answer to this problem. Discussed dependency risk. Xanax just refilled.   BP is up. Did improve after sitting. Discussed elevated blood pressure and risk. Continue to check for next 2 weeks if over 140/90 regularly needs to consider medication. It could be due to stress so considering SSRI could  help too.   Refilled ambien for sleep.   Pt needs colonoscopy/cologuard. Encouraged patient to return kit.   Follow up in 6 weeks.     Follow Up Instructions:    I discussed the assessment and treatment plan with the patient. The patient was provided an opportunity to ask questions and all were answered. The patient agreed with the plan and demonstrated an understanding of the instructions.   The patient was advised to call back or seek an in-person evaluation if the symptoms worsen or if the condition fails to improve as anticipated.  I provided 30 minutes of non-face-to-face time during this encounter.   Iran Planas, PA-C

## 2020-11-25 ENCOUNTER — Encounter: Payer: Self-pay | Admitting: Neurology

## 2020-11-28 ENCOUNTER — Ambulatory Visit (INDEPENDENT_AMBULATORY_CARE_PROVIDER_SITE_OTHER): Payer: 59 | Admitting: Psychology

## 2020-11-28 ENCOUNTER — Encounter: Payer: Self-pay | Admitting: Physician Assistant

## 2020-11-28 ENCOUNTER — Telehealth: Payer: Self-pay | Admitting: Neurology

## 2020-11-28 ENCOUNTER — Other Ambulatory Visit: Payer: Self-pay | Admitting: Physician Assistant

## 2020-11-28 DIAGNOSIS — F4322 Adjustment disorder with anxiety: Secondary | ICD-10-CM | POA: Diagnosis not present

## 2020-11-28 MED ORDER — AMLODIPINE BESYLATE 2.5 MG PO TABS: 2.5000 mg | ORAL_TABLET | Freq: Every day | ORAL | 1 refills | Status: DC

## 2020-11-28 NOTE — Telephone Encounter (Signed)
Patient called back and spoke with Victorino Dike, she will get forms to [place]. Needs today - April 5th for hypertension, stress. Will await forms.

## 2020-11-28 NOTE — Telephone Encounter (Signed)
Forms completed and faxed to [employer] at [phone] with confirmation received. Copy to scan.

## 2020-11-28 NOTE — Telephone Encounter (Signed)
LMOM for patient letting her know we have not received FMLA forms, but only a cover sheet that is 1 of 1 stating to complete FMLA forms. Also need to know exactly what she needs leave for and what kind of time she is looking for. Awaiting call back to let us know.

## 2020-12-28 ENCOUNTER — Other Ambulatory Visit (HOSPITAL_BASED_OUTPATIENT_CLINIC_OR_DEPARTMENT_OTHER): Payer: Self-pay

## 2020-12-28 ENCOUNTER — Encounter (HOSPITAL_BASED_OUTPATIENT_CLINIC_OR_DEPARTMENT_OTHER): Payer: Self-pay | Admitting: Emergency Medicine

## 2020-12-28 ENCOUNTER — Emergency Department (HOSPITAL_BASED_OUTPATIENT_CLINIC_OR_DEPARTMENT_OTHER): Payer: 59

## 2020-12-28 ENCOUNTER — Emergency Department (HOSPITAL_BASED_OUTPATIENT_CLINIC_OR_DEPARTMENT_OTHER)
Admission: EM | Admit: 2020-12-28 | Discharge: 2020-12-28 | Disposition: A | Discharge status: Home / Self Care | Payer: 59 | Attending: Emergency Medicine | Admitting: Emergency Medicine

## 2020-12-28 ENCOUNTER — Other Ambulatory Visit: Payer: Self-pay

## 2020-12-28 DIAGNOSIS — N201 Calculus of ureter: Secondary | ICD-10-CM | POA: Insufficient documentation

## 2020-12-28 DIAGNOSIS — M5136 Other intervertebral disc degeneration, lumbar region: Secondary | ICD-10-CM | POA: Diagnosis not present

## 2020-12-28 DIAGNOSIS — J45909 Unspecified asthma, uncomplicated: Secondary | ICD-10-CM | POA: Diagnosis not present

## 2020-12-28 DIAGNOSIS — N132 Hydronephrosis with renal and ureteral calculous obstruction: Secondary | ICD-10-CM | POA: Diagnosis not present

## 2020-12-28 DIAGNOSIS — E876 Hypokalemia: Secondary | ICD-10-CM | POA: Diagnosis not present

## 2020-12-28 DIAGNOSIS — M4186 Other forms of scoliosis, lumbar region: Secondary | ICD-10-CM | POA: Diagnosis not present

## 2020-12-28 DIAGNOSIS — R1031 Right lower quadrant pain: Secondary | ICD-10-CM | POA: Diagnosis present

## 2020-12-28 DIAGNOSIS — N200 Calculus of kidney: Secondary | ICD-10-CM | POA: Diagnosis not present

## 2020-12-28 DIAGNOSIS — K8020 Calculus of gallbladder without cholecystitis without obstruction: Secondary | ICD-10-CM | POA: Diagnosis not present

## 2020-12-28 LAB — URINALYSIS, ROUTINE W REFLEX MICROSCOPIC
Bilirubin Urine: NEGATIVE
Glucose, UA: NEGATIVE mg/dL
Ketones, ur: NEGATIVE mg/dL
Nitrite: NEGATIVE
Protein, ur: NEGATIVE mg/dL
Specific Gravity, Urine: 1.015 (ref 1.005–1.030)
pH: 7.5 (ref 5.0–8.0)

## 2020-12-28 LAB — COMPREHENSIVE METABOLIC PANEL
ALT: 26 U/L (ref 0–44)
AST: 26 U/L (ref 15–41)
Albumin: 4.2 g/dL (ref 3.5–5.0)
Alkaline Phosphatase: 76 U/L (ref 38–126)
Anion gap: 13 (ref 5–15)
BUN: 8 mg/dL (ref 6–20)
CO2: 24 mmol/L (ref 22–32)
Calcium: 9.3 mg/dL (ref 8.9–10.3)
Chloride: 100 mmol/L (ref 98–111)
Creatinine, Ser: 0.72 mg/dL (ref 0.44–1.00)
GFR, Estimated: >60 mL/min (ref >60)
Glucose, Bld: 129 mg/dL — ABNORMAL HIGH (ref 70–99)
Potassium: 3.1 mmol/L — ABNORMAL LOW (ref 3.5–5.1)
Sodium: 137 mmol/L (ref 135–145)
Total Bilirubin: 0.6 mg/dL (ref 0.3–1.2)
Total Protein: 8.4 g/dL — ABNORMAL HIGH (ref 6.5–8.1)

## 2020-12-28 LAB — CBC WITH DIFFERENTIAL/PLATELET
Abs Immature Granulocytes: 0.05 10*3/uL (ref 0.00–0.07)
Basophils Absolute: 0.1 10*3/uL (ref 0.0–0.1)
Basophils Relative: 1 %
Eosinophils Absolute: 0.1 10*3/uL (ref 0.0–0.5)
Eosinophils Relative: 1 %
HCT: 41.9 % (ref 36.0–46.0)
Hemoglobin: 15.1 g/dL — ABNORMAL HIGH (ref 12.0–15.0)
Immature Granulocytes: 1 %
Lymphocytes Relative: 43 %
Lymphs Abs: 4 10*3/uL (ref 0.7–4.0)
MCH: 27.4 pg (ref 26.0–34.0)
MCHC: 36 g/dL (ref 30.0–36.0)
MCV: 75.9 fL — ABNORMAL LOW (ref 80.0–100.0)
Monocytes Absolute: 0.8 10*3/uL (ref 0.1–1.0)
Monocytes Relative: 9 %
Neutro Abs: 4.3 10*3/uL (ref 1.7–7.7)
Neutrophils Relative %: 45 %
Platelets: 267 10*3/uL (ref 150–400)
RBC: 5.52 MIL/uL — ABNORMAL HIGH (ref 3.87–5.11)
RDW: 13.3 % (ref 11.5–15.5)
WBC: 9.4 10*3/uL (ref 4.0–10.5)
nRBC: 0 % (ref 0.0–0.2)

## 2020-12-28 LAB — URINALYSIS, MICROSCOPIC (REFLEX): RBC / HPF: >50 RBC/hpf (ref 0–5)

## 2020-12-28 MED ORDER — POTASSIUM CHLORIDE CRYS ER 20 MEQ PO TBCR
20.0000 meq | EXTENDED_RELEASE_TABLET | Freq: Every day | ORAL | 0 refills | Status: DC
  Filled 2020-12-28: qty 3, 3d supply, fill #0

## 2020-12-28 MED ORDER — ONDANSETRON 4 MG PO TBDP
4.0000 mg | ORAL_TABLET | Freq: Three times a day (TID) | ORAL | 0 refills | Status: DC | PRN
  Filled 2020-12-28: qty 10, 4d supply, fill #0

## 2020-12-28 MED ORDER — OXYCODONE-ACETAMINOPHEN 5-325 MG PO TABS
1.0000 | ORAL_TABLET | ORAL | 0 refills | Status: DC | PRN
  Filled 2020-12-28: qty 10, 2d supply, fill #0

## 2020-12-28 MED ORDER — SODIUM CHLORIDE 0.9 % IV BOLUS
1000.0000 mL | Freq: Once | INTRAVENOUS | Status: AC
  Administered 2020-12-28: 1000 mL via INTRAVENOUS

## 2020-12-28 MED ORDER — HYDROMORPHONE HCL 1 MG/ML IJ SOLN
1.0000 mg | Freq: Once | INTRAMUSCULAR | Status: AC
  Administered 2020-12-28: 1 mg via INTRAVENOUS
  Filled 2020-12-28: qty 1

## 2020-12-28 MED ORDER — OXYCODONE-ACETAMINOPHEN 5-325 MG PO TABS
1.0000 | ORAL_TABLET | Freq: Once | ORAL | Status: AC
  Administered 2020-12-28: 1 via ORAL
  Filled 2020-12-28: qty 1

## 2020-12-28 MED ORDER — ONDANSETRON HCL 4 MG/2ML IJ SOLN
4.0000 mg | Freq: Once | INTRAMUSCULAR | Status: AC
  Administered 2020-12-28: 4 mg via INTRAVENOUS
  Filled 2020-12-28: qty 2

## 2020-12-28 MED ORDER — KETOROLAC TROMETHAMINE 15 MG/ML IJ SOLN
15.0000 mg | Freq: Once | INTRAMUSCULAR | Status: AC
  Administered 2020-12-28: 15 mg via INTRAVENOUS
  Filled 2020-12-28: qty 1

## 2020-12-28 NOTE — ED Provider Notes (Signed)
[facility] EMERGENCY DEPARTMENT Provider Note   CSN: [id number] Arrival date & time: 12/28/20  1055     History Chief Complaint  Patient presents with  . Abdominal Pain    Angela Benton is a 55 y.o. female.  HPI 55 year old female presents with severe RLQ abdominal pain.  Started mildly yesterday but much more severe today.  It is a 10 out of 10.  The patient has noticed dark urine but no dysuria over the last couple days.  Not specifically bloody.  However now is having the severe right lower quadrant pain.  Took 2 ibuprofen a couple hours ago with no relief.  No back pain or diarrhea.  No vomiting but has felt some nausea.  Past Medical History:  Diagnosis Date  . Herniated lumbar intervertebral disc    L4 and L5  . History of chicken pox   . Insomnia   . Sickle cell trait [facility])     Patient Active Problem List   Diagnosis Date Noted  . Lower abdominal pain 06/28/2020  . Bloating 06/27/2020  . Constipation 07/10/2019  . Stress at work 01/20/2019  . Anxiety 01/20/2019  . Liver cyst 08/27/2017  . Overweight 03/28/2015  . Elevated blood pressure reading 12/23/2014  . Abnormal weight gain 06/25/2014  . Dyslipidemia 04/06/2014  . Right lumbar radiculitis 02/01/2014  . Vitamin D deficiency 06/24/2012  . Sickle cell trait (HCC) 06/20/2012  . Primary insomnia 10/29/2011  . Herniated lumbar intervertebral disc 10/29/2011  . Childhood asthma 10/29/2011    Past Surgical History:  Procedure Laterality Date  . CESAREAN SECTION       OB History   No obstetric history on file.     Family History  Problem Relation Age of Onset  . Diabetes Mother   . CAD Mother   . Prostate cancer Father   . Cancer Maternal Aunt        Throat    Social History   Tobacco Use  . Smoking status: Never Smoker  . Smokeless tobacco: Never Used  Substance Use Topics  . Alcohol use: Yes    Comment: Rarely  . Drug use: No    Home Medications Prior to Admission  medications   Medication Sig Start Date End Date Taking? Authorizing Provider  ondansetron (ZOFRAN ODT) 4 MG disintegrating tablet Take 1 tablet (4 mg total) by mouth every 8 (eight) hours as needed for nausea or vomiting. 12/28/20  Yes Pricilla Loveless, MD  oxyCODONE-acetaminophen (PERCOCET) 5-325 MG tablet Take 1 tablet by mouth every 4 (four) hours as needed for severe pain. 12/28/20  Yes Pricilla Loveless, MD  potassium chloride SA (KLOR-CON) 20 MEQ tablet Take 1 tablet (20 mEq total) by mouth daily. 12/28/20  Yes Pricilla Loveless, MD  ALPRAZolam (XANAX) 0.25 MG tablet TAKE 1 TABLET (0.25 MG TOTAL) BY MOUTH 2 (TWO) TIMES DAILY AS NEEDED FOR ANXIETY. 11/16/20   Breeback, Jade L, PA-C  amLODipine (NORVASC) 2.5 MG tablet Take 1 tablet (2.5 mg total) by mouth daily. 11/28/20   Jomarie Longs, PA-C  COVID-19 mRNA vaccine, Moderna, 100 MCG/0.5ML injection INJECT AS DIRECTED 07/18/20 07/18/21  Judyann Munson, MD  cyclobenzaprine (FLEXERIL) 10 MG tablet TAKE ONE-HALF TABLET BY MOUTH AT BEDTIME THEN INCREASE GRADUALLY TO TAKE ONE TABLET BY MOUTH THREE TIMES A DAY ONLY USE AS NEEDED 06/14/20   Breeback, Jade L, PA-C  Probiotic Product (PROBIOTIC DAILY) CAPS One by mouth daily.  May substitute per insurance formulary. 08/28/17   Breeback,  Jade L, PA-C  zolpidem (AMBIEN) 10 MG tablet Take 1 tablet (10 mg total) by mouth at bedtime as needed. for sleep 11/21/20   Jomarie Longs, PA-C    Allergies    Saxenda [liraglutide -weight management]  Review of Systems   Review of Systems  Constitutional: Negative for fever.  Gastrointestinal: Positive for abdominal pain and nausea. Negative for diarrhea and vomiting.  Genitourinary: Negative for dysuria and hematuria.  Musculoskeletal: Negative for back pain.  All other systems reviewed and are negative.   Physical Exam Updated Vital Signs BP (!) 150/88 (BP Location: Left Arm)   Pulse 95   Temp 98.2 F (36.8 C) (Oral)   Resp 18   Ht 5\' 7"  (1.702 m)   Wt  81.6 kg   SpO2 98%   BMI 28.19 kg/m   Physical Exam Vitals and nursing note reviewed.  Constitutional:      Appearance: She is well-developed. She is not ill-appearing or diaphoretic.  HENT:     Head: Normocephalic and atraumatic.     Right Ear: External ear normal.     Left Ear: External ear normal.     Nose: Nose normal.  Eyes:     General:        Right eye: No discharge.        Left eye: No discharge.  Cardiovascular:     Rate and Rhythm: Normal rate and regular rhythm.     Heart sounds: Normal heart sounds.  Pulmonary:     Effort: Pulmonary effort is normal.     Breath sounds: Normal breath sounds.  Abdominal:     Palpations: Abdomen is soft.     Tenderness: There is no abdominal tenderness. There is right CVA tenderness (mild).  Skin:    General: Skin is warm and dry.  Neurological:     Mental Status: She is alert.  Psychiatric:        Mood and Affect: Mood is not anxious.     ED Results / Procedures / Treatments   Labs (all labs ordered are listed, but only abnormal results are displayed) Labs Reviewed  COMPREHENSIVE METABOLIC PANEL - Abnormal; Notable for the following components:      Result Value   Potassium 3.1 (*)    Glucose, Bld 129 (*)    Total Protein 8.4 (*)    All other components within normal limits  CBC WITH DIFFERENTIAL/PLATELET - Abnormal; Notable for the following components:   RBC 5.52 (*)    Hemoglobin 15.1 (*)    MCV 75.9 (*)    All other components within normal limits  URINALYSIS, ROUTINE W REFLEX MICROSCOPIC - Abnormal; Notable for the following components:   APPearance CLOUDY (*)    Hgb urine dipstick LARGE (*)    Leukocytes,Ua TRACE (*)    All other components within normal limits  URINALYSIS, MICROSCOPIC (REFLEX) - Abnormal; Notable for the following components:   Bacteria, UA FEW (*)    All other components within normal limits    EKG None  Radiology CT Renal Stone Study  Result Date: 12/28/2020 CLINICAL DATA:  Onset  right lower quadrant pain this morning. Discolored urine for 2 days. EXAM: CT ABDOMEN AND PELVIS WITHOUT CONTRAST TECHNIQUE: Multidetector CT imaging of the abdomen and pelvis was performed following the standard protocol without IV contrast. COMPARISON:  CT abdomen and pelvis 08/27/2017. FINDINGS: Lower chest: There is some dependent atelectasis in the lung bases. No pleural or pericardial effusion. Hepatobiliary: 2-3 small stones are seen  in the gallbladder but there is no CT evidence of cholecystitis. No focal liver lesion is identified. There is diffuse fatty infiltration of the liver. Biliary tree is negative. Pancreas: Unremarkable. No pancreatic ductal dilatation or surrounding inflammatory changes. Spleen: Normal in size without focal abnormality. Adrenals/Urinary Tract: The adrenal glands appear normal. The patient has moderate right hydronephrosis due to a 0.4 cm stone in the proximal right ureter. No other urinary tract stones are identified. No focal renal lesion is seen. Next areas Stomach/Bowel: Stomach is within normal limits. Appendix appears normal. No evidence of bowel wall thickening, distention, or inflammatory changes. Vascular/Lymphatic: No significant vascular findings are present. No enlarged abdominal or pelvic lymph nodes. Reproductive: Uterus and bilateral adnexa are unremarkable. Other: Laxity of the anterior abdominal wall at the midline is unchanged. Musculoskeletal: No acute or focal abnormality. Convex left lumbar scoliosis and degenerative disease L3-4 and L4-5 is seen. IMPRESSION: Moderate right hydronephrosis due to a 0.4 cm proximal right ureteral stone. No other acute abnormality. Fatty infiltration of the liver. 2-3 small gallstones or evidence of cholecystitis. Degenerative disc disease L3-4 and L4-5. Mild convex left lumbar scoliosis noted. Electronically Signed   By: Drusilla Kanner M.D.   On: 12/28/2020 12:10    Procedures Procedures   Medications Ordered in  ED Medications  HYDROmorphone (DILAUDID) injection 1 mg (1 mg Intravenous Given 12/28/20 1204)  sodium chloride 0.9 % bolus 1,000 mL (0 mLs Intravenous Stopped 12/28/20 1305)  ondansetron (ZOFRAN) injection 4 mg (4 mg Intravenous Given 12/28/20 1243)  oxyCODONE-acetaminophen (PERCOCET/ROXICET) 5-325 MG per tablet 1 tablet (1 tablet Oral Given 12/28/20 1350)  ketorolac (TORADOL) 15 MG/ML injection 15 mg (15 mg Intravenous Given 12/28/20 1350)    ED Course  I have reviewed the triage vital signs and the nursing notes.  Pertinent labs & imaging results that were available during my care of the patient were reviewed by me and considered in my medical decision making (see chart for details).    MDM Rules/Calculators/A&P                          CT shows 4 mm right ureteral stone that appears to be the cause of her symptoms.  No other significant pathology that would cause pain today.  Pain is much better after the Dilaudid and then she was given Percocet and Toradol.  At this point, there is no obvious infectious symptoms and given her pain control and the vomiting has stopped I think she is stable for outpatient follow-up with urology.  Given the size she should pass it on her own hopefully.  Advised to continue to use NSAIDs and Tylenol as well.  Discussed return precautions. Final Clinical Impression(s) / ED Diagnoses Final diagnoses:  Right ureteral stone  Hypokalemia    Rx / DC Orders ED Discharge Orders         Ordered    oxyCODONE-acetaminophen (PERCOCET) 5-325 MG tablet  Every 4 hours PRN        12/28/20 1433    ondansetron (ZOFRAN ODT) 4 MG disintegrating tablet  Every 8 hours PRN        12/28/20 1452    potassium chloride SA (KLOR-CON) 20 MEQ tablet  Daily        12/28/20 1452           Pricilla Loveless, MD 12/28/20 1453

## 2020-12-28 NOTE — Discharge Instructions (Signed)
If you develop worsening, continued, or recurrent abdominal pain, uncontrolled vomiting, fever, chest or back pain, or any other new/concerning symptoms then return to the ER for evaluation.  

## 2020-12-28 NOTE — ED Notes (Signed)
Pt vomited ~322mls

## 2020-12-28 NOTE — ED Triage Notes (Signed)
Patient complaining of RLQ abdominal pain since this AM. Started after waking. Also reports dark urine x 2 days.

## 2021-01-03 ENCOUNTER — Other Ambulatory Visit (HOSPITAL_BASED_OUTPATIENT_CLINIC_OR_DEPARTMENT_OTHER): Payer: Self-pay

## 2021-01-03 ENCOUNTER — Emergency Department (HOSPITAL_BASED_OUTPATIENT_CLINIC_OR_DEPARTMENT_OTHER)
Admission: EM | Admit: 2021-01-03 | Discharge: 2021-01-03 | Disposition: A | Discharge status: Home / Self Care | Payer: 59 | Attending: Emergency Medicine | Admitting: Emergency Medicine

## 2021-01-03 ENCOUNTER — Other Ambulatory Visit: Payer: Self-pay

## 2021-01-03 ENCOUNTER — Emergency Department (HOSPITAL_BASED_OUTPATIENT_CLINIC_OR_DEPARTMENT_OTHER): Payer: 59

## 2021-01-03 ENCOUNTER — Encounter (HOSPITAL_BASED_OUTPATIENT_CLINIC_OR_DEPARTMENT_OTHER): Payer: Self-pay | Admitting: Emergency Medicine

## 2021-01-03 DIAGNOSIS — N390 Urinary tract infection, site not specified: Secondary | ICD-10-CM | POA: Diagnosis not present

## 2021-01-03 DIAGNOSIS — N201 Calculus of ureter: Secondary | ICD-10-CM | POA: Diagnosis not present

## 2021-01-03 DIAGNOSIS — R1031 Right lower quadrant pain: Secondary | ICD-10-CM | POA: Diagnosis present

## 2021-01-03 DIAGNOSIS — N200 Calculus of kidney: Secondary | ICD-10-CM

## 2021-01-03 DIAGNOSIS — R109 Unspecified abdominal pain: Secondary | ICD-10-CM | POA: Diagnosis not present

## 2021-01-03 LAB — CBC WITH DIFFERENTIAL/PLATELET
Abs Immature Granulocytes: 0.01 10*3/uL (ref 0.00–0.07)
Basophils Absolute: 0.1 10*3/uL (ref 0.0–0.1)
Basophils Relative: 1 %
Eosinophils Absolute: 0.1 10*3/uL (ref 0.0–0.5)
Eosinophils Relative: 1 %
HCT: 39.5 % (ref 36.0–46.0)
Hemoglobin: 14 g/dL (ref 12.0–15.0)
Immature Granulocytes: 0 %
Lymphocytes Relative: 24 %
Lymphs Abs: 2.2 10*3/uL (ref 0.7–4.0)
MCH: 26.9 pg (ref 26.0–34.0)
MCHC: 35.4 g/dL (ref 30.0–36.0)
MCV: 75.8 fL — ABNORMAL LOW (ref 80.0–100.0)
Monocytes Absolute: 0.7 10*3/uL (ref 0.1–1.0)
Monocytes Relative: 8 %
Neutro Abs: 5.9 10*3/uL (ref 1.7–7.7)
Neutrophils Relative %: 66 %
Platelets: 220 10*3/uL (ref 150–400)
RBC: 5.21 MIL/uL — ABNORMAL HIGH (ref 3.87–5.11)
RDW: 13 % (ref 11.5–15.5)
WBC: 8.9 10*3/uL (ref 4.0–10.5)
nRBC: 0 % (ref 0.0–0.2)

## 2021-01-03 LAB — URINALYSIS, MICROSCOPIC (REFLEX): WBC, UA: >50 WBC/hpf (ref 0–5)

## 2021-01-03 LAB — URINALYSIS, ROUTINE W REFLEX MICROSCOPIC
Bilirubin Urine: NEGATIVE
Glucose, UA: NEGATIVE mg/dL
Ketones, ur: NEGATIVE mg/dL
Nitrite: NEGATIVE
Protein, ur: 30 mg/dL — AB
Specific Gravity, Urine: 1.025 (ref 1.005–1.030)
pH: 6 (ref 5.0–8.0)

## 2021-01-03 LAB — BASIC METABOLIC PANEL
Anion gap: 9 (ref 5–15)
BUN: 11 mg/dL (ref 6–20)
CO2: 23 mmol/L (ref 22–32)
Calcium: 9.2 mg/dL (ref 8.9–10.3)
Chloride: 105 mmol/L (ref 98–111)
Creatinine, Ser: 0.81 mg/dL (ref 0.44–1.00)
GFR, Estimated: >60 mL/min (ref >60)
Glucose, Bld: 114 mg/dL — ABNORMAL HIGH (ref 70–99)
Potassium: 3.4 mmol/L — ABNORMAL LOW (ref 3.5–5.1)
Sodium: 137 mmol/L (ref 135–145)

## 2021-01-03 MED ORDER — ONDANSETRON HCL 4 MG/2ML IJ SOLN
4.0000 mg | Freq: Once | INTRAMUSCULAR | Status: AC
  Administered 2021-01-03: 4 mg via INTRAVENOUS
  Filled 2021-01-03: qty 2

## 2021-01-03 MED ORDER — CEPHALEXIN 500 MG PO CAPS
500.0000 mg | ORAL_CAPSULE | Freq: Three times a day (TID) | ORAL | 0 refills | Status: DC
  Filled 2021-01-03: qty 21, 7d supply, fill #0

## 2021-01-03 MED ORDER — SODIUM CHLORIDE 0.9 % IV BOLUS
1000.0000 mL | Freq: Once | INTRAVENOUS | Status: AC
  Administered 2021-01-03: 1000 mL via INTRAVENOUS

## 2021-01-03 MED ORDER — KETOROLAC TROMETHAMINE 30 MG/ML IJ SOLN
30.0000 mg | Freq: Once | INTRAMUSCULAR | Status: AC
  Administered 2021-01-03: 30 mg via INTRAVENOUS
  Filled 2021-01-03: qty 1

## 2021-01-03 MED ORDER — SODIUM CHLORIDE 0.9 % IV SOLN
1.0000 g | Freq: Once | INTRAVENOUS | Status: AC
  Administered 2021-01-03: 1 g via INTRAVENOUS
  Filled 2021-01-03: qty 10

## 2021-01-03 MED ORDER — TAMSULOSIN HCL 0.4 MG PO CAPS
0.4000 mg | ORAL_CAPSULE | Freq: Every day | ORAL | 0 refills | Status: DC
  Filled 2021-01-03: qty 30, 30d supply, fill #0

## 2021-01-03 MED ORDER — MORPHINE SULFATE (PF) 4 MG/ML IV SOLN
4.0000 mg | Freq: Once | INTRAVENOUS | Status: AC
  Administered 2021-01-03: 4 mg via INTRAVENOUS
  Filled 2021-01-03: qty 1

## 2021-01-03 NOTE — ED Triage Notes (Signed)
Pt c/o right sided abd pain with nausea. Seen here recently for kidney stone.

## 2021-01-03 NOTE — ED Notes (Signed)
ED Provider at bedside. 

## 2021-01-03 NOTE — ED Notes (Signed)
Pt will stay here until 0730 due to driving self.

## 2021-01-03 NOTE — ED Provider Notes (Signed)
[facility] EMERGENCY DEPARTMENT Provider Note   CSN: [id number] Arrival date & time: 01/03/21  0250     History Chief Complaint  Patient presents with  . Abdominal Pain    Angela Benton is a 55 y.o. female.  HPI     This is a 55 year old female with a history of recent kidney stone who presents with recurrent right-sided abdominal pain.  Patient reports that she was seen and evaluated on Wednesday.  She was found to have a kidney stone.  She was discharged with pain medication.  She reports that she has been drinking plenty of fluids.  She thought she was doing better because she had resolving symptoms and by Saturday felt much better.  However, 3 hours ago she woke up with recurrent right lower abdominal discomfort and right flank pain.  She rates her pain at 9 out of 10.  She took 2 ibuprofen but did not take her oxycodone.  She states that she was unsure whether she should take this.  She has not had any fevers.  No dysuria but has noted foul-smelling urine.  She reports nausea without vomiting.  She does not have an established urologist.  Past Medical History:  Diagnosis Date  . Herniated lumbar intervertebral disc    L4 and L5  . History of chicken pox   . Insomnia   . Sickle cell trait [facility])     Patient Active Problem List   Diagnosis Date Noted  . Lower abdominal pain 06/28/2020  . Bloating 06/27/2020  . Constipation 07/10/2019  . Stress at work 01/20/2019  . Anxiety 01/20/2019  . Liver cyst 08/27/2017  . Overweight 03/28/2015  . Elevated blood pressure reading 12/23/2014  . Abnormal weight gain 06/25/2014  . Dyslipidemia 04/06/2014  . Right lumbar radiculitis 02/01/2014  . Vitamin D deficiency 06/24/2012  . Sickle cell trait (HCC) 06/20/2012  . Primary insomnia 10/29/2011  . Herniated lumbar intervertebral disc 10/29/2011  . Childhood asthma 10/29/2011    Past Surgical History:  Procedure Laterality Date  . CESAREAN SECTION       OB History    No obstetric history on file.     Family History  Problem Relation Age of Onset  . Diabetes Mother   . CAD Mother   . Prostate cancer Father   . Cancer Maternal Aunt        Throat    Social History   Tobacco Use  . Smoking status: Never Smoker  . Smokeless tobacco: Never Used  Substance Use Topics  . Alcohol use: Yes    Comment: Rarely  . Drug use: No    Home Medications Prior to Admission medications   Medication Sig Start Date End Date Taking? Authorizing Provider  cephALEXin (KEFLEX) 500 MG capsule Take 1 capsule (500 mg total) by mouth 3 (three) times daily. 01/03/21  Yes Aalaysia Liggins, Mayer Masker, MD  tamsulosin (FLOMAX) 0.4 MG CAPS capsule Take 1 capsule (0.4 mg total) by mouth daily. 01/03/21  Yes Takao Lizer, Mayer Masker, MD  ALPRAZolam (XANAX) 0.25 MG tablet TAKE 1 TABLET (0.25 MG TOTAL) BY MOUTH 2 (TWO) TIMES DAILY AS NEEDED FOR ANXIETY. 11/16/20   Breeback, Jade L, PA-C  amLODipine (NORVASC) 2.5 MG tablet Take 1 tablet (2.5 mg total) by mouth daily. 11/28/20   Jomarie Longs, PA-C  COVID-19 mRNA vaccine, Moderna, 100 MCG/0.5ML injection INJECT AS DIRECTED 07/18/20 07/18/21  Judyann Munson, MD  cyclobenzaprine (FLEXERIL) 10 MG tablet TAKE ONE-HALF TABLET BY MOUTH AT BEDTIME  THEN INCREASE GRADUALLY TO TAKE ONE TABLET BY MOUTH THREE TIMES A DAY ONLY USE AS NEEDED 06/14/20   Breeback, Jade L, PA-C  ondansetron (ZOFRAN ODT) 4 MG disintegrating tablet Take 1 tablet (4 mg total) by mouth every 8 (eight) hours as needed for nausea or vomiting. 12/28/20   Pricilla Loveless, MD  oxyCODONE-acetaminophen (PERCOCET) 5-325 MG tablet Take 1 tablet by mouth every 4 (four) hours as needed for severe pain. 12/28/20   Pricilla Loveless, MD  potassium chloride SA (KLOR-CON) 20 MEQ tablet Take 1 tablet (20 mEq total) by mouth daily. 12/28/20   Pricilla Loveless, MD  Probiotic Product (PROBIOTIC DAILY) CAPS One by mouth daily.  May substitute per insurance formulary. 08/28/17   Breeback, Jade L, PA-C  zolpidem  (AMBIEN) 10 MG tablet Take 1 tablet (10 mg total) by mouth at bedtime as needed. for sleep 11/21/20   Jomarie Longs, PA-C    Allergies    Saxenda [liraglutide -weight management]  Review of Systems   Review of Systems  Constitutional: Negative for fever.  Respiratory: Negative for shortness of breath.   Gastrointestinal: Positive for abdominal pain and nausea. Negative for vomiting.  Genitourinary: Positive for flank pain. Negative for dysuria.  All other systems reviewed and are negative.   Physical Exam Updated Vital Signs BP (!) 167/85   Pulse 90   Temp 98.1 F (36.7 C) (Oral)   Resp 18   Ht 1.702 m (5\' 7" )   Wt 81.6 kg   LMP 12/06/2020 (Within Days)   SpO2 96%   BMI 28.18 kg/m   Physical Exam Vitals and nursing note reviewed.  Constitutional:      Appearance: She is well-developed. She is obese. She is not ill-appearing.  HENT:     Head: Normocephalic and atraumatic.     Mouth/Throat:     Mouth: Mucous membranes are moist.  Eyes:     Pupils: Pupils are equal, round, and reactive to light.  Cardiovascular:     Rate and Rhythm: Normal rate and regular rhythm.     Heart sounds: Normal heart sounds.  Pulmonary:     Effort: Pulmonary effort is normal. No respiratory distress.     Breath sounds: No wheezing.  Abdominal:     General: Bowel sounds are normal.     Palpations: Abdomen is soft.     Tenderness: There is abdominal tenderness in the right lower quadrant. There is no right CVA tenderness, left CVA tenderness, guarding or rebound.  Musculoskeletal:     Cervical back: Neck supple.  Skin:    General: Skin is warm and dry.  Neurological:     Mental Status: She is alert and oriented to person, place, and time.  Psychiatric:        Mood and Affect: Mood normal.     ED Results / Procedures / Treatments   Labs (all labs ordered are listed, but only abnormal results are displayed) Labs Reviewed  CBC WITH DIFFERENTIAL/PLATELET - Abnormal; Notable for the  following components:      Result Value   RBC 5.21 (*)    MCV 75.8 (*)    All other components within normal limits  BASIC METABOLIC PANEL - Abnormal; Notable for the following components:   Potassium 3.4 (*)    Glucose, Bld 114 (*)    All other components within normal limits  URINALYSIS, ROUTINE W REFLEX MICROSCOPIC - Abnormal; Notable for the following components:   APPearance HAZY (*)    Hgb urine dipstick  LARGE (*)    Protein, ur 30 (*)    Leukocytes,Ua LARGE (*)    All other components within normal limits  URINALYSIS, MICROSCOPIC (REFLEX) - Abnormal; Notable for the following components:   Bacteria, UA MANY (*)    All other components within normal limits  URINE CULTURE    EKG None  Radiology DG Abdomen 1 View  Result Date: 01/03/2021 CLINICAL DATA:  Kidney stones.  Right-sided abdominal pain EXAM: ABDOMEN - 1 VIEW COMPARISON:  12/28/2020 abdominal CT FINDINGS: 5 mm stone over the right hemipelvis which is migrated from a more proximal location on the prior study. No additional urolithiasis. Normal bowel gas pattern. Lower lumbar disc degeneration IMPRESSION: Progression of 5 mm right ureteral calculus into the right pelvic ureter. Electronically Signed   By: Marnee Spring M.D.   On: 01/03/2021 04:09    Procedures Procedures   Medications Ordered in ED Medications  sodium chloride 0.9 % bolus 1,000 mL (1,000 mLs Intravenous New Bag/Given 01/03/21 0339)  morphine 4 MG/ML injection 4 mg (4 mg Intravenous Given 01/03/21 0325)  ondansetron (ZOFRAN) injection 4 mg (4 mg Intravenous Given 01/03/21 0325)  cefTRIAXone (ROCEPHIN) 1 g in sodium chloride 0.9 % 100 mL IVPB (0 g Intravenous Stopped 01/03/21 0429)  ketorolac (TORADOL) 30 MG/ML injection 30 mg (30 mg Intravenous Given 01/03/21 0358)    ED Course  I have reviewed the triage vital signs and the nursing notes.  Pertinent labs & imaging results that were available during my care of the patient were reviewed by me and  considered in my medical decision making (see chart for details).  Clinical Course as of 01/03/21 0531  Tue Jan 03, 2021  0527 Spoke with urology, Estrellita Ludwig, as patient is well-appearing and nonseptic, recommends discharge with antibiotics and Flomax.  Call office first thing this morning to be seen later today or first thing tomorrow.  If she has worsening pain or fevers, she will need to be evaluated more immediately. [CH]    Clinical Course User Index [CH] Tirso Laws, Mayer Masker, MD   MDM Rules/Calculators/A&P                          Patient presents with recurrent right-sided abdominal and flank pain consistent with her recent kidney stone.  She is nontoxic-appearing and afebrile.  Vital signs notable for blood pressure 167/85.  Suspect recurrent kidney stone pain.  However, given foul-smelling urine, onset of pyelonephritis is also consideration.  Lab work and KUB obtained.  KUB shows progression of stones distally.  No leukocytosis and no significant metabolic derangements.  She does not have greater than 50 white cells and many bacteria in her urine.  While there are epithelials present, this is significantly changed from her prior urine.  We will culture.  Patient was given Rocephin.  See discussion above with urology.  As patient is well-appearing and nonseptic, feel it is reasonable for discharge home with close urology follow-up.  Patient was given return precautions including fever.  After history, exam, and medical workup I feel the patient has been appropriately medically screened and is safe for discharge home. Pertinent diagnoses were discussed with the patient. Patient was given return precautions.  Final Clinical Impression(s) / ED Diagnoses Final diagnoses:  Kidney stone  Complicated UTI (urinary tract infection)    Rx / DC Orders ED Discharge Orders         Ordered    cephALEXin (KEFLEX) 500 MG  capsule  3 times daily        01/03/21 0529    tamsulosin (FLOMAX) 0.4 MG  CAPS capsule  Daily        01/03/21 0529           Shon BatonHorton, Heloise Gordan F, MD 01/03/21 [phone]

## 2021-01-03 NOTE — Discharge Instructions (Addendum)
You were seen today for recurrent abdominal pain related to kidney stones.  Your stone does appear to be moving.  However, you may have developed a urinary tract infection.  Take antibiotics, pain medication, and Flomax as directed.  Call urology office first thing this morning to be seen later today or tomorrow.  If you develop fevers or worsening symptoms, you should be evaluated immediately.

## 2021-01-04 LAB — URINE CULTURE

## 2021-01-18 ENCOUNTER — Other Ambulatory Visit (HOSPITAL_BASED_OUTPATIENT_CLINIC_OR_DEPARTMENT_OTHER): Payer: Self-pay

## 2021-01-18 ENCOUNTER — Encounter: Payer: Self-pay | Admitting: Physician Assistant

## 2021-01-18 ENCOUNTER — Ambulatory Visit (INDEPENDENT_AMBULATORY_CARE_PROVIDER_SITE_OTHER): Payer: 59 | Admitting: Physician Assistant

## 2021-01-18 ENCOUNTER — Other Ambulatory Visit: Payer: Self-pay

## 2021-01-18 DIAGNOSIS — R300 Dysuria: Secondary | ICD-10-CM | POA: Diagnosis not present

## 2021-01-18 LAB — POCT URINALYSIS DIP (CLINITEK)
Bilirubin, UA: NEGATIVE
Glucose, UA: NEGATIVE mg/dL
Ketones, POC UA: NEGATIVE mg/dL
Nitrite, UA: NEGATIVE
POC PROTEIN,UA: 100 — AB
Spec Grav, UA: >=1.03 — AB (ref 1.010–1.025)
Urobilinogen, UA: 0.2 E.U./dL
pH, UA: 5.5 (ref 5.0–8.0)

## 2021-01-18 MED ORDER — NITROFURANTOIN MONOHYD MACRO 100 MG PO CAPS
100.0000 mg | ORAL_CAPSULE | Freq: Two times a day (BID) | ORAL | 0 refills | Status: DC
  Filled 2021-01-18: qty 10, 5d supply, fill #0

## 2021-01-18 NOTE — Progress Notes (Signed)
Recent history of kidney stones. Off keflex for 2 weeks. No flank pain and all other symptoms had resolved. Today started with dark urine and dysuria. No fever, chills, flank pain.   .. Results for orders placed or performed in visit on 01/18/21  POCT URINALYSIS DIP (CLINITEK)  Result Value Ref Range   Color, UA brown (A) yellow   Clarity, UA turbid (A) clear   Glucose, UA negative negative mg/dL   Bilirubin, UA negative negative   Ketones, POC UA negative negative mg/dL   Spec Grav, UA >=4.081 (A) 1.010 - 1.025   Blood, UA large (A) negative   pH, UA 5.5 5.0 - 8.0   POC PROTEIN,UA =100 (A) negative, trace   Urobilinogen, UA 0.2 0.2 or 1.0 E.U./dL   Nitrite, UA Negative Negative   Leukocytes, UA Trace (A) Negative   Will culture.  Empirically treat for UTI based on symptoms and UA with macrobid.  Symptomatic treatment discussed.

## 2021-01-20 LAB — URINE CULTURE
MICRO NUMBER:: 11908928
SPECIMEN QUALITY:: ADEQUATE

## 2021-01-20 NOTE — Progress Notes (Signed)
How are your urinary symptoms? Culture did not show significant bacteria. I think this could be more inflammation and possible even kidney stone moving out.

## 2021-01-26 ENCOUNTER — Other Ambulatory Visit: Payer: Self-pay | Admitting: Physician Assistant

## 2021-02-21 ENCOUNTER — Encounter: Payer: Self-pay | Admitting: Physician Assistant

## 2021-03-01 ENCOUNTER — Other Ambulatory Visit (HOSPITAL_BASED_OUTPATIENT_CLINIC_OR_DEPARTMENT_OTHER): Payer: Self-pay

## 2021-05-13 ENCOUNTER — Other Ambulatory Visit: Payer: Self-pay | Admitting: Physician Assistant

## 2021-05-13 ENCOUNTER — Other Ambulatory Visit: Payer: Self-pay

## 2021-05-13 ENCOUNTER — Ambulatory Visit: Payer: Self-pay

## 2021-05-13 DIAGNOSIS — Z1231 Encounter for screening mammogram for malignant neoplasm of breast: Secondary | ICD-10-CM

## 2021-05-24 ENCOUNTER — Encounter: Payer: Self-pay | Admitting: Physician Assistant

## 2021-05-24 ENCOUNTER — Telehealth (INDEPENDENT_AMBULATORY_CARE_PROVIDER_SITE_OTHER): Payer: BC Managed Care – PPO | Admitting: Physician Assistant

## 2021-05-24 VITALS — BP 176/64 | HR 72

## 2021-05-24 DIAGNOSIS — Z79899 Other long term (current) drug therapy: Secondary | ICD-10-CM

## 2021-05-24 DIAGNOSIS — F5101 Primary insomnia: Secondary | ICD-10-CM | POA: Diagnosis not present

## 2021-05-24 DIAGNOSIS — I10 Essential (primary) hypertension: Secondary | ICD-10-CM | POA: Diagnosis not present

## 2021-05-24 DIAGNOSIS — E876 Hypokalemia: Secondary | ICD-10-CM

## 2021-05-24 DIAGNOSIS — F419 Anxiety disorder, unspecified: Secondary | ICD-10-CM | POA: Diagnosis not present

## 2021-05-24 MED ORDER — AMLODIPINE BESYLATE 2.5 MG PO TABS: 2.5000 mg | ORAL_TABLET | Freq: Every day | ORAL | 0 refills | Status: DC

## 2021-05-24 MED ORDER — ALPRAZOLAM 0.25 MG PO TABS: 0.2500 mg | ORAL_TABLET | Freq: Two times a day (BID) | ORAL | 0 refills | Status: DC | PRN

## 2021-05-24 MED ORDER — ZOLPIDEM TARTRATE 10 MG PO TABS: 10.0000 mg | ORAL_TABLET | Freq: Every evening | ORAL | 1 refills | Status: DC | PRN

## 2021-05-24 NOTE — Progress Notes (Signed)
..Virtual Visit via Telephone Note  I connected with Angela Benton on 05/24/21 at  1:00 PM EDT by telephone and verified that I am speaking with the correct person using two identifiers.  Location: Patient: home Provider: clinic  .Marland KitchenParticipating in visit:  Patient: Angela Benton Provider:Siriah Treat Caleen Essex PA-C   I discussed the limitations, risks, security and privacy concerns of performing an evaluation and management service by telephone and the availability of in person appointments. I also discussed with the patient that there may be a patient responsible charge related to this service. The patient expressed understanding and agreed to proceed.   History of Present Illness: Pt is a 55 yo female with HTN, anxiety, insomnia who presents to the clinic for medication follow up.   Patient admits to thinking that her blood pressure was doing well because she was not having headaches and feeling stressed so she stopped her amlodipine.  She did recheck her blood pressure this morning and it was 170/64.  She is asymptomatic.  She denies any headaches, shortness of breath, palpitations, chest pain, dizziness.  Her anxiety has much decreased since starting a new job at [employer].  They really value mental health and she feels very supported.  She is only using the Xanax very sparingly.  She almost does not even need a refill but she would like a refill today just says she has some if needed.  Insomnia is controlled with Ambien.  Patient did have to go to the hospital for some kidney stones and her potassium was low.  She was put on potassium for a few days.  She has not had her potassium rechecked.  She is not taking potassium.  .. Active Ambulatory Problems    Diagnosis Date Noted   Primary insomnia 10/29/2011   Herniated lumbar intervertebral disc 10/29/2011   Childhood asthma 10/29/2011   Sickle cell trait (HCC) 06/20/2012   Vitamin D deficiency 06/24/2012   Right lumbar radiculitis 02/01/2014    Dyslipidemia 04/06/2014   Abnormal weight gain 06/25/2014   Elevated blood pressure reading 12/23/2014   Overweight 03/28/2015   Liver cyst 08/27/2017   Stress at work 01/20/2019   Anxiety 01/20/2019   Constipation 07/10/2019   Bloating 06/27/2020   Lower abdominal pain 06/28/2020   Resolved Ambulatory Problems    Diagnosis Date Noted   Obese 07/30/2016   Class 1 obesity due to excess calories without serious comorbidity with body mass index (BMI) of 30.0 to 30.9 in adult 07/01/2017   Past Medical History:  Diagnosis Date   History of chicken pox    Insomnia        Observations/Objective: No acute distress Normal breathing Normal mood  .[name] Today's Vitals   05/24/21 1304  BP: (!) 176/64  Pulse: 72   There is no height or weight on file to calculate BMI.   Assessment and Plan: Marland KitchenMarland KitchenEmmerson was seen today for follow-up.  Diagnoses and all orders for this visit:  Primary hypertension -     amLODipine (NORVASC) 2.5 MG tablet; Take 1 tablet (2.5 mg total) by mouth daily. -     Basic metabolic panel  Primary insomnia -     zolpidem (AMBIEN) 10 MG tablet; Take 1 tablet (10 mg total) by mouth at bedtime as needed. for sleep  Anxiety -     ALPRAZolam (XANAX) 0.25 MG tablet; Take 1 tablet (0.25 mg total) by mouth 2 (two) times daily as needed for anxiety.  Medication management -     Basic metabolic  panel  Hypokalemia -     Basic metabolic panel  Discussed with patient that her blood pressure is not controlled.  In fact it is very elevated.  She needs to restart her amlodipine and send me blood pressure readings over the next 2 weeks.  We may need to even increase from amlodipine 2.5.  Encouraged DASH diet.  We do need to recheck a BMP for her potassium.  This lab was sent to [facility].  Ambien was refilled for 6 months.  Xanax No. 30 was refilled.  Patient is aware to take this sparingly.    Follow Up Instructions:    I discussed the  assessment and treatment plan with the patient. The patient was provided an opportunity to ask questions and all were answered. The patient agreed with the plan and demonstrated an understanding of the instructions.   The patient was advised to call back or seek an in-person evaluation if the symptoms worsen or if the condition fails to improve as anticipated.  I provided 20 minutes of non-face-to-face time during this encounter.   Tandy Gaw, PA-C

## 2021-08-04 ENCOUNTER — Other Ambulatory Visit: Payer: Self-pay | Admitting: Physician Assistant

## 2021-08-04 DIAGNOSIS — F4321 Adjustment disorder with depressed mood: Secondary | ICD-10-CM

## 2021-08-04 DIAGNOSIS — F419 Anxiety disorder, unspecified: Secondary | ICD-10-CM

## 2021-08-04 MED ORDER — ALPRAZOLAM 0.25 MG PO TABS: 0.2500 mg | ORAL_TABLET | Freq: Two times a day (BID) | ORAL | 0 refills | Status: DC | PRN

## 2021-08-04 NOTE — Progress Notes (Signed)
Pt lost brother and having a hard time with anxiety.  Refilled xanax for as needed.

## 2021-10-15 IMAGING — MG DIGITAL SCREENING BILAT W/ TOMO W/ CAD
8 series · 8 of 24 positions shown · non-contrast
Comparison: Previous exam(s).

CLINICAL DATA: Screening.

EXAM:
DIGITAL SCREENING BILATERAL MAMMOGRAM WITH TOMO AND CAD

[R MLO synth-2D]
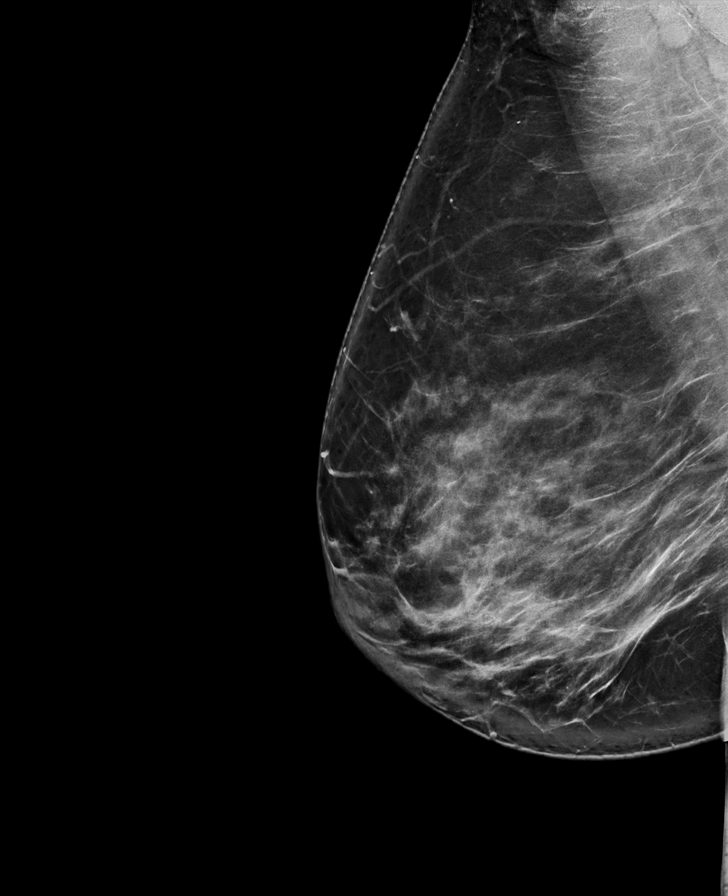

[R CC synth-2D]
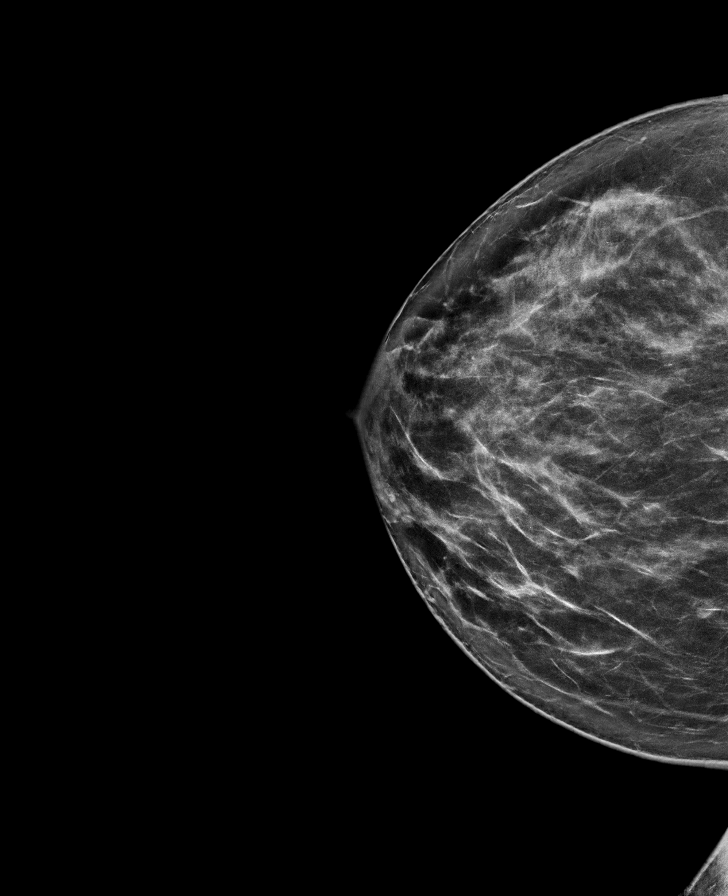

[L CC synth-2D]
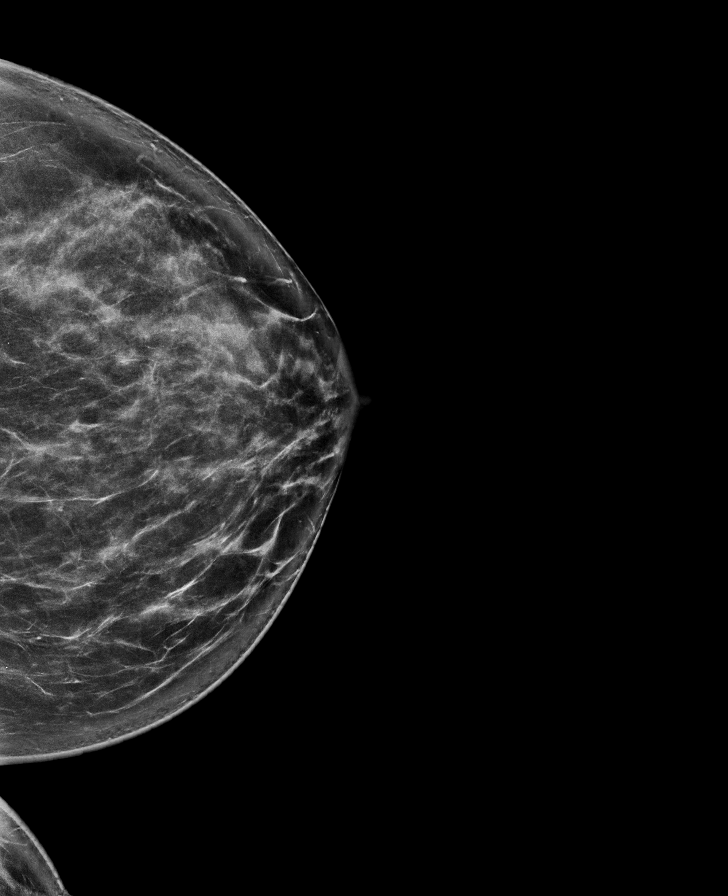

[L MLO synth-2D]
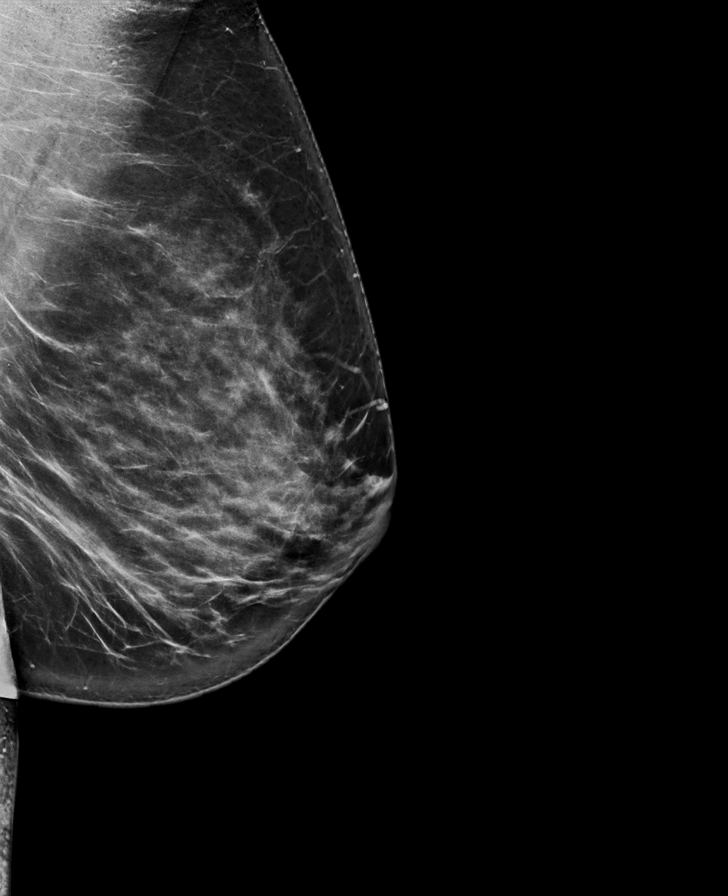

[R MLO tomo · tomo slice 50/99.0]
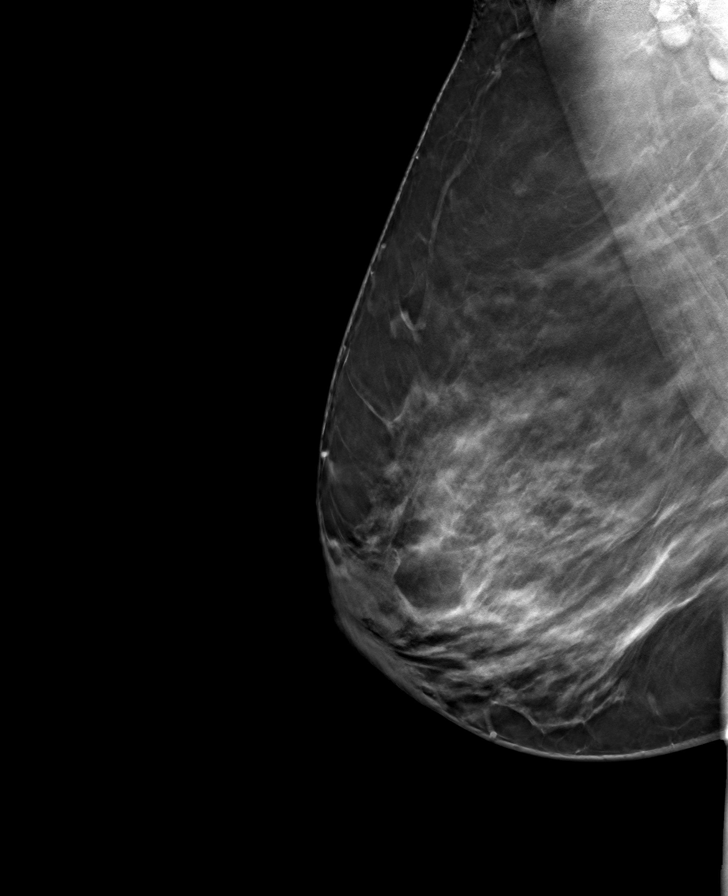

[L MLO tomo · tomo slice 49/97.0]
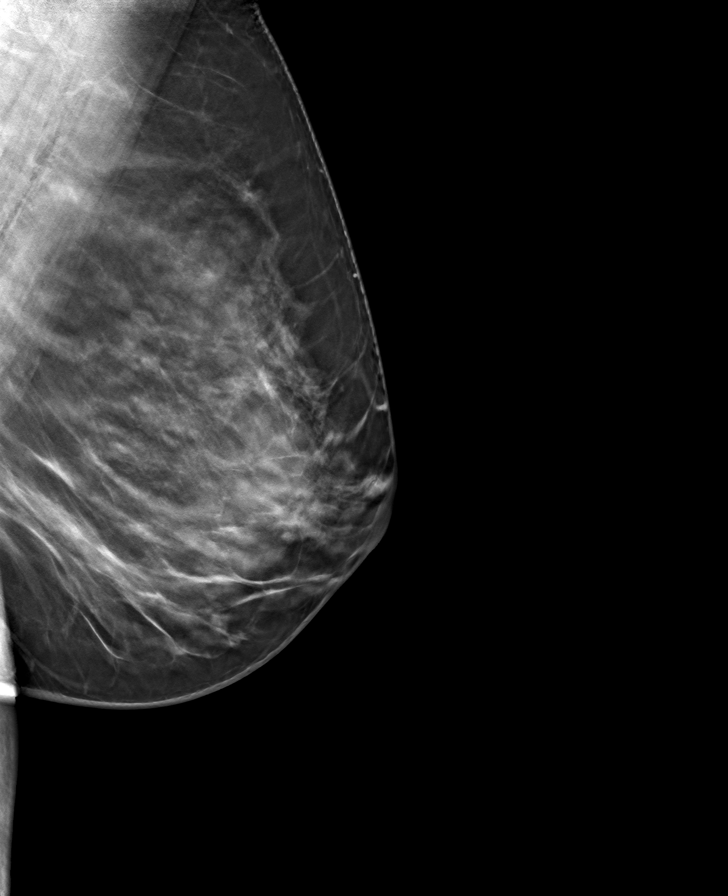

[L CC tomo · tomo slice 45/90.0]
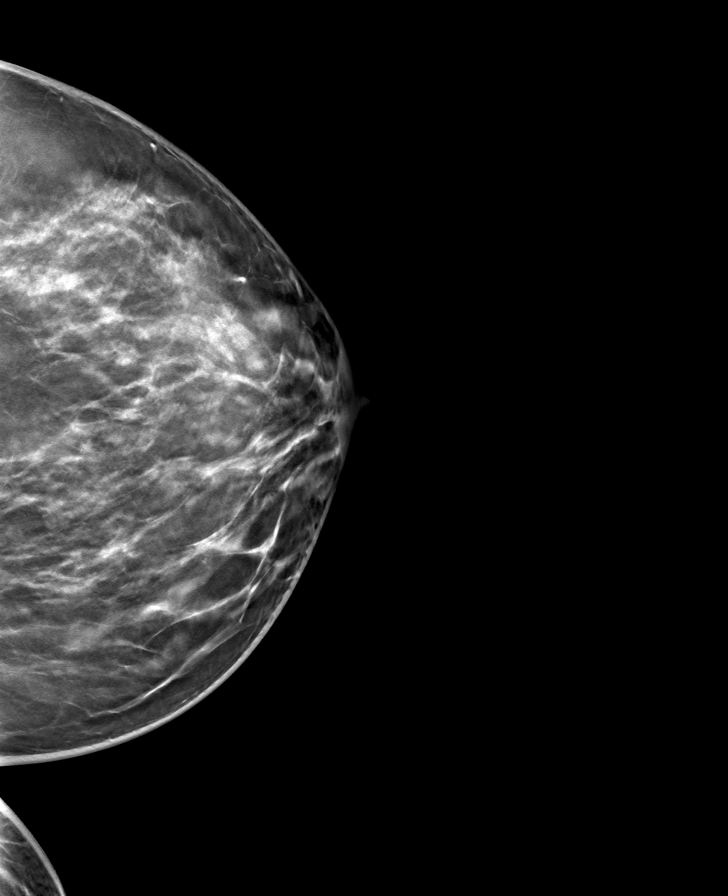

[R CC tomo · tomo slice 45/88.0]
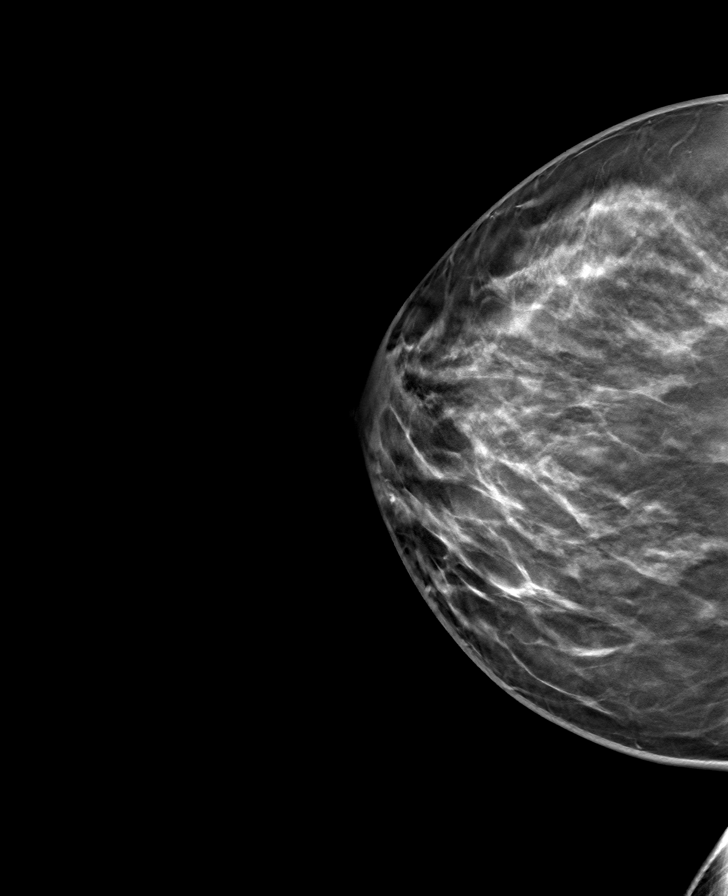

[8 of 24 positions shown; findings below may reference images not displayed]

ACR Breast Density Category c: The breast tissue is heterogeneously
dense, which may obscure small masses.
FINDINGS: There are no findings suspicious for malignancy. Images were
processed with CAD.
IMPRESSION: No mammographic evidence of malignancy. A result letter of this
screening mammogram will be mailed directly to the patient.

RECOMMENDATION:
Screening mammogram in one year. (Code:[id number])

BI-RADS CATEGORY  1: Negative.

## 2021-10-29 ENCOUNTER — Other Ambulatory Visit: Payer: Self-pay | Admitting: Physician Assistant

## 2021-10-29 DIAGNOSIS — F419 Anxiety disorder, unspecified: Secondary | ICD-10-CM

## 2021-10-29 DIAGNOSIS — M5416 Radiculopathy, lumbar region: Secondary | ICD-10-CM

## 2021-10-30 ENCOUNTER — Other Ambulatory Visit: Payer: Self-pay | Admitting: Physician Assistant

## 2021-10-30 DIAGNOSIS — F5101 Primary insomnia: Secondary | ICD-10-CM

## 2021-10-31 ENCOUNTER — Other Ambulatory Visit: Payer: Self-pay | Admitting: Physician Assistant

## 2021-10-31 DIAGNOSIS — F419 Anxiety disorder, unspecified: Secondary | ICD-10-CM

## 2021-10-31 DIAGNOSIS — M5416 Radiculopathy, lumbar region: Secondary | ICD-10-CM

## 2021-11-01 MED ORDER — ALPRAZOLAM 0.25 MG PO TABS: 0.2500 mg | ORAL_TABLET | Freq: Two times a day (BID) | ORAL | 0 refills | Status: DC | PRN

## 2021-11-01 MED ORDER — CYCLOBENZAPRINE HCL 10 MG PO TABS: ORAL_TABLET | ORAL | 0 refills | Status: DC

## 2021-11-21 ENCOUNTER — Telehealth (INDEPENDENT_AMBULATORY_CARE_PROVIDER_SITE_OTHER): Payer: BC Managed Care – PPO | Admitting: Physician Assistant

## 2021-11-21 ENCOUNTER — Encounter: Payer: Self-pay | Admitting: Physician Assistant

## 2021-11-21 VITALS — BP 120/86 | Ht 67.0 in | Wt 184.0 lb

## 2021-11-21 DIAGNOSIS — F419 Anxiety disorder, unspecified: Secondary | ICD-10-CM | POA: Diagnosis not present

## 2021-11-21 DIAGNOSIS — F5101 Primary insomnia: Secondary | ICD-10-CM

## 2021-11-21 MED ORDER — ZOLPIDEM TARTRATE 10 MG PO TABS: 10.0000 mg | ORAL_TABLET | Freq: Every evening | ORAL | 1 refills | Status: DC | PRN

## 2021-11-21 NOTE — Progress Notes (Signed)
PHQ9 (11) -GAD7 (3) completed.  ?Brother just passed away ? ?Needs refills ? ?

## 2021-11-21 NOTE — Progress Notes (Signed)
..Virtual Visit via Telephone Note ? ?I connected with Angela Benton on 11/21/21 at  8:10 AM EDT by telephone and verified that I am speaking with the correct person using two identifiers. ? ?Location: ?Patient: work ?Provider: clinic ? ?[name].Participating in visit:  ?Patient: Angela Benton ?Provider: Tandy Gaw PA-C ?Provider in training: Joyice Faster PA-S ?  ?I discussed the limitations, risks, security and privacy concerns of performing an evaluation and management service by telephone and the availability of in person appointments. I also discussed with the patient that there may be a patient responsible charge related to this service. The patient expressed understanding and agreed to proceed. ? ? ?History of Present Illness: ?Pt is a 56 yo female with insomnia who needs refills of ambien. 5mg  did not work. She uses the 10mg  nightly. Pt has no concern or complaints. She is grieving her brother who passed away recently but doing better. Using xanax very sparingly. She is doing well at work and with stress right now.  ? ?.. ?Active Ambulatory Problems  ?  Diagnosis Date Noted  ? Primary insomnia 10/29/2011  ? Herniated lumbar intervertebral disc 10/29/2011  ? Childhood asthma 10/29/2011  ? Sickle cell trait (HCC) 06/20/2012  ? Vitamin D deficiency 06/24/2012  ? Right lumbar radiculitis 02/01/2014  ? Dyslipidemia 04/06/2014  ? Abnormal weight gain 06/25/2014  ? Elevated blood pressure reading 12/23/2014  ? Overweight 03/28/2015  ? Liver cyst 08/27/2017  ? Stress at work 01/20/2019  ? Anxiety 01/20/2019  ? Constipation 07/10/2019  ? Bloating 06/27/2020  ? Lower abdominal pain 06/28/2020  ? ?Resolved Ambulatory Problems  ?  Diagnosis Date Noted  ? Obese 07/30/2016  ? Class 1 obesity due to excess calories without serious comorbidity with body mass index (BMI) of 30.0 to 30.9 in adult 07/01/2017  ? ?Past Medical History:  ?Diagnosis Date  ? History of chicken pox   ? Insomnia   ? ? ? ?  ?Observations/Objective: ?No acute  distress ?Normal mood and appearance ? ?.. ?Today's Vitals  ? 11/21/21 0804  ?BP: 120/86  ?Weight: 184 lb (83.5 kg)  ?Height: 5\' 7"  (1.702 m)  ? ?Body mass index is 28.82 kg/m?. ? ? ?.. ?Depression screen [facility] 2/9 11/21/2021 07/10/2019  ?Decreased Interest 2 0  ?Down, Depressed, Hopeless 2 0  ?PHQ - 2 Score 4 0  ?Altered sleeping 3 -  ?Tired, decreased energy 2 -  ?Change in appetite 2 -  ?Feeling bad or failure about yourself  0 -  ?Trouble concentrating 0 -  ?Moving slowly or fidgety/restless 0 -  ?Suicidal thoughts 0 -  ?PHQ-9 Score 11 -  ?Difficult doing work/chores Not difficult at all -  ?Some encounter information is confidential and restricted. Go to Review Flowsheets activity to see all data.  ? ?.. ?GAD 7 : Generalized Anxiety Score 11/21/2021 11/22/2020 07/10/2019  ?Nervous, Anxious, on Edge 1 3 0  ?Control/stop worrying 1 3 0  ?Worry too much - different things 0 3 0  ?Trouble relaxing 0 3 1  ?Restless 0 3 0  ?Easily annoyed or irritable 1 3 3   ?Afraid - awful might happen 0 3 0  ?Total GAD 7 Score 3 21 4   ?Anxiety Difficulty Somewhat difficult Very difficult Somewhat difficult  ?Some encounter information is confidential and restricted. Go to Review Flowsheets activity to see all data.  ? ? ? ?Assessment and Plan: ?..Angela Benton was seen today for follow-up. ? ?Diagnoses and all orders for this visit: ? ?Anxiety ? ?Primary insomnia ?-  zolpidem (AMBIEN) 10 MG tablet; Take 1 tablet (10 mg total) by mouth at bedtime as needed. for sleep ? ? ?PHQ/GAD stable and anxiety improved a lot.  ?Xanax only as needed. ?Ambien refilled for 6 months.  ?Needs fasting labs and CPE. Follow up in 6 months.  ? ? ?Follow Up Instructions: ? ?  ?I discussed the assessment and treatment plan with the patient. The patient was provided an opportunity to ask questions and all were answered. The patient agreed with the plan and demonstrated an understanding of the instructions. ?  ?The patient was advised to call back or seek an in-person  evaluation if the symptoms worsen or if the condition fails to improve as anticipated. ? ?I provided 10 minutes of non-face-to-face time during this encounter. ? ? ?Tandy Gaw, PA-C ? ?

## 2021-12-26 ENCOUNTER — Encounter: Payer: Self-pay | Admitting: Physician Assistant

## 2021-12-26 ENCOUNTER — Telehealth (INDEPENDENT_AMBULATORY_CARE_PROVIDER_SITE_OTHER): Payer: BC Managed Care – PPO | Admitting: Physician Assistant

## 2021-12-26 VITALS — HR 96 | Temp 100.2°F

## 2021-12-26 DIAGNOSIS — R509 Fever, unspecified: Secondary | ICD-10-CM

## 2021-12-26 DIAGNOSIS — R051 Acute cough: Secondary | ICD-10-CM

## 2021-12-26 DIAGNOSIS — R6883 Chills (without fever): Secondary | ICD-10-CM | POA: Diagnosis not present

## 2021-12-26 DIAGNOSIS — R6889 Other general symptoms and signs: Secondary | ICD-10-CM | POA: Diagnosis not present

## 2021-12-26 DIAGNOSIS — R52 Pain, unspecified: Secondary | ICD-10-CM

## 2021-12-26 MED ORDER — HYDROCODONE BIT-HOMATROP MBR 5-1.5 MG/5ML PO SOLN: 5.0000 mL | Freq: Three times a day (TID) | ORAL | 0 refills | Status: DC | PRN

## 2021-12-26 MED ORDER — OSELTAMIVIR PHOSPHATE 75 MG PO CAPS: 75.0000 mg | ORAL_CAPSULE | Freq: Two times a day (BID) | ORAL | 0 refills | Status: DC

## 2021-12-26 MED ORDER — HYDROCOD POLI-CHLORPHE POLI ER 10-8 MG/5ML PO SUER: 5.0000 mL | Freq: Two times a day (BID) | ORAL | 0 refills | Status: DC | PRN

## 2021-12-26 NOTE — Addendum Note (Signed)
Addended by: Jomarie Longs on: 12/26/2021 04:40 PM ? ? Modules accepted: Orders ? ?

## 2021-12-26 NOTE — Telephone Encounter (Signed)
Patient scheduled.

## 2021-12-26 NOTE — Progress Notes (Signed)
..Virtual Visit via Telephone Note ? ?I connected with Angela Benton on 12/26/21 at  1:40 PM EDT by telephone and verified that I am speaking with the correct person using two identifiers. ? ?Location: ?Patient: home  ?Provider: clinic ? ?[name].Participating in visit:  ?Patient: Angela Benton ?Provider:Devaughn Savant PA-C ? ?I discussed the limitations, risks, security and privacy concerns of performing an evaluation and management service by telephone and the availability of in person appointments. I also discussed with the patient that there may be a patient responsible charge related to this service. The patient expressed understanding and agreed to proceed. ? ? ?History of Present Illness: ?Pt is a 56 yo female with cough, fever, body aches, chills, throat congestion that started suddenly yesterday. Negative home covid test. She feels like "flu". She has had no sick contacts. She has tried some mucinex without any benefit. No problems breathing. Throat does ache but not bad.  ? ?Email: [email or web address] ? ?.. ?Active Ambulatory Problems  ?  Diagnosis Date Noted  ? Primary insomnia 10/29/2011  ? Herniated lumbar intervertebral disc 10/29/2011  ? Childhood asthma 10/29/2011  ? Sickle cell trait (HCC) 06/20/2012  ? Vitamin D deficiency 06/24/2012  ? Right lumbar radiculitis 02/01/2014  ? Dyslipidemia 04/06/2014  ? Abnormal weight gain 06/25/2014  ? Elevated blood pressure reading 12/23/2014  ? Overweight 03/28/2015  ? Liver cyst 08/27/2017  ? Stress at work 01/20/2019  ? Anxiety 01/20/2019  ? Constipation 07/10/2019  ? Bloating 06/27/2020  ? Lower abdominal pain 06/28/2020  ? Congestion of throat 12/26/2021  ? Body aches 12/26/2021  ? ?Resolved Ambulatory Problems  ?  Diagnosis Date Noted  ? Obese 07/30/2016  ? Class 1 obesity due to excess calories without serious comorbidity with body mass index (BMI) of 30.0 to 30.9 in adult 07/01/2017  ? ?Past Medical History:  ?Diagnosis Date  ? History of chicken pox   ? Insomnia    ? ? ? ?  ?Observations/Objective: ?No acute distress ?No labored breathing ?Dry cough ? ?.. ?Today's Vitals  ? 12/26/21 1354  ?Pulse: 96  ?Temp: 100.2 ?F (37.9 ?C)  ?TempSrc: Oral  ? ?There is no height or weight on file to calculate BMI. ? ? ? ?Assessment and Plan: ?..[name] was seen today for nasal congestion. ? ?Diagnoses and all orders for this visit: ? ?Fever, unspecified fever cause ?-     oseltamivir (TAMIFLU) 75 MG capsule; Take 1 capsule (75 mg total) by mouth 2 (two) times daily. ? ?Chills ?-     oseltamivir (TAMIFLU) 75 MG capsule; Take 1 capsule (75 mg total) by mouth 2 (two) times daily. ? ?Body aches ?-     oseltamivir (TAMIFLU) 75 MG capsule; Take 1 capsule (75 mg total) by mouth 2 (two) times daily. ? ?Congestion of throat ?-     oseltamivir (TAMIFLU) 75 MG capsule; Take 1 capsule (75 mg total) by mouth 2 (two) times daily. ? ?Acute cough ?-     oseltamivir (TAMIFLU) 75 MG capsule; Take 1 capsule (75 mg total) by mouth 2 (two) times daily. ?-     HYDROcodone bit-homatropine (HYCODAN) 5-1.5 MG/5ML syrup; Take 5 mLs by mouth every 8 (eight) hours as needed for cough. ? ? ?Pt could not make it to be swabbed for flu and strep. ?Will send tamiflu ?No significant ST ?Discussed symptomatic care ?Hycodan for cough ?Continue mucinex ?Tylenol/ibuprofen for fever/body aches ?Follow up as needed or if symptoms change or worsen ?Note to be written out of work for rest  of today and tomorrow.  ? ? ?Follow Up Instructions: ? ?  ?I discussed the assessment and treatment plan with the patient. The patient was provided an opportunity to ask questions and all were answered. The patient agreed with the plan and demonstrated an understanding of the instructions. ?  ?The patient was advised to call back or seek an in-person evaluation if the symptoms worsen or if the condition fails to improve as anticipated. ? ?I provided 10 minutes of non-face-to-face time during this encounter. ? ? ?Tandy Gaw, PA-C ? ?

## 2021-12-26 NOTE — Progress Notes (Signed)
Hycodan out of stock.  ?  ?Sent tussionex.  ?

## 2021-12-27 ENCOUNTER — Other Ambulatory Visit: Payer: Self-pay | Admitting: Neurology

## 2021-12-27 NOTE — Telephone Encounter (Signed)
Received call from [facility].  ?They do not have Hycodan in stock. They are asking if cough medication can be changed to Tussionex? Please advise. Pended, sign if appropriate.  ?

## 2022-04-09 ENCOUNTER — Encounter: Payer: Self-pay | Admitting: Physician Assistant

## 2022-04-09 DIAGNOSIS — F5101 Primary insomnia: Secondary | ICD-10-CM

## 2022-04-09 DIAGNOSIS — M5416 Radiculopathy, lumbar region: Secondary | ICD-10-CM

## 2022-04-09 DIAGNOSIS — F419 Anxiety disorder, unspecified: Secondary | ICD-10-CM

## 2022-04-10 MED ORDER — ALPRAZOLAM 0.25 MG PO TABS: 0.2500 mg | ORAL_TABLET | Freq: Two times a day (BID) | ORAL | 0 refills | Status: DC | PRN

## 2022-04-10 MED ORDER — ZOLPIDEM TARTRATE 10 MG PO TABS: 10.0000 mg | ORAL_TABLET | Freq: Every evening | ORAL | 1 refills | Status: DC | PRN

## 2022-04-10 MED ORDER — CYCLOBENZAPRINE HCL 10 MG PO TABS: ORAL_TABLET | ORAL | 0 refills | Status: DC

## 2022-05-08 ENCOUNTER — Other Ambulatory Visit: Payer: Self-pay | Admitting: Physician Assistant

## 2022-05-08 DIAGNOSIS — Z1231 Encounter for screening mammogram for malignant neoplasm of breast: Secondary | ICD-10-CM

## 2022-06-20 ENCOUNTER — Other Ambulatory Visit: Payer: Self-pay | Admitting: Physician Assistant

## 2022-06-20 DIAGNOSIS — F419 Anxiety disorder, unspecified: Secondary | ICD-10-CM

## 2022-06-20 MED ORDER — ALPRAZOLAM 0.25 MG PO TABS: 0.2500 mg | ORAL_TABLET | Freq: Two times a day (BID) | ORAL | 0 refills | Status: DC | PRN

## 2022-06-20 NOTE — Telephone Encounter (Signed)
Last OV: 12/26/21 Next OV: no appt scheduled Last RF: 04/10/22

## 2022-09-12 ENCOUNTER — Other Ambulatory Visit: Payer: Self-pay | Admitting: Physician Assistant

## 2022-09-12 DIAGNOSIS — F419 Anxiety disorder, unspecified: Secondary | ICD-10-CM

## 2022-09-12 NOTE — Telephone Encounter (Signed)
Please call for appt  

## 2022-09-12 NOTE — Telephone Encounter (Signed)
Last written 06/20/2022 #30 with no refills   Last appt December 26, 2021

## 2022-09-12 NOTE — Telephone Encounter (Signed)
Will refill but does need appt every 6 months.

## 2022-09-14 ENCOUNTER — Telehealth (INDEPENDENT_AMBULATORY_CARE_PROVIDER_SITE_OTHER): Payer: BC Managed Care – PPO | Admitting: Physician Assistant

## 2022-09-14 ENCOUNTER — Encounter: Payer: Self-pay | Admitting: Physician Assistant

## 2022-09-14 DIAGNOSIS — F5101 Primary insomnia: Secondary | ICD-10-CM | POA: Diagnosis not present

## 2022-09-14 DIAGNOSIS — F419 Anxiety disorder, unspecified: Secondary | ICD-10-CM

## 2022-09-14 DIAGNOSIS — R4586 Emotional lability: Secondary | ICD-10-CM

## 2022-09-14 DIAGNOSIS — R454 Irritability and anger: Secondary | ICD-10-CM

## 2022-09-14 DIAGNOSIS — R4589 Other symptoms and signs involving emotional state: Secondary | ICD-10-CM | POA: Insufficient documentation

## 2022-09-14 MED ORDER — SERTRALINE HCL 50 MG PO TABS: ORAL_TABLET | ORAL | 1 refills | Status: DC

## 2022-09-14 MED ORDER — ALPRAZOLAM 0.25 MG PO TABS: 0.2500 mg | ORAL_TABLET | Freq: Two times a day (BID) | ORAL | 0 refills | Status: DC | PRN

## 2022-09-14 MED ORDER — ZOLPIDEM TARTRATE 10 MG PO TABS: 10.0000 mg | ORAL_TABLET | Freq: Every evening | ORAL | 1 refills | Status: DC | PRN

## 2022-09-14 NOTE — Patient Instructions (Signed)
Managing Anger, Adult Everyone feels angry from time to time. It is okay and normal to feel angry. However, the way that you behave or react to anger can make it a problem. How do problems managing my anger affect me? Reacting too strongly to anger, acting out aggressively, or not expressing it at all can: Create fear in others. Cause others to react with anger as well. Cause others to shut down. Create legal problems. Stress Anger can trigger stress-related problems, such as headaches, poor digestion, or trouble sleeping. Stress can make anger harder to manage. When anger is out of control it is likely to create more stress. Relationships Anger can cause emotional relationship problems at home. Aggressive anger can lead to harming someone else. Anger at work is often unacceptable and may lead to job problems. Unpredictable anger can cause problems with friends. Your health Anger can affect your health. Anger has an impact on: Your mental health. Blood pressure. Heart rate. Hormone production. Anger has been associated with: Migraine headaches. Cardiovascular disease. High blood pressure. Chest pain. What actions can I take to manage anger?     Anger management is not just a matter of staying silent. It involves self awareness of when you are angry and then developing and practicing skills to express it clearly and appropriately. Express anger in a healthy way Use the following healthy strategies to help you manage your anger: Step away. When you are feeling reactive, it may take at least 20 minutes for your body to return to its normal blood pressure and heart rate. To help your body do this, take a walk, listen to music, stretch, take deep breaths, and avoid the person or situation that has you feeling angry. Try to discuss your anger when you feel calm again. Consider how others may feel before you react. Avoid swearing, sighing, raising your voice, or blaming. Realize that  you can have the feeling of anger, but you do not have to become the feeling. All feelings happen and usually lessen over time. Choose a good time to work through problems and reach agreement with the other person. You may be more likely to lose your temper at the end of the day when you are tired. The other person may also still be reactive for a while. Set a time and place to come back together that works for both of you. Keep a journal of your feelings. Write down situations in which you become angry. This helps you know your feelings and may help you figure out what triggers your anger. Change the way you think about the situation Change your perception. Consider if there is another way you can view the situation that will leave you with a different emotion. Sometimes, changing the way you think about a situation can make it seem less infuriating. Here are some ways to do that: Remind yourself that everyone is not out to get you. Remind yourself that a disappointing result is not the end of the world. You can cope with being disappointed. Take steps to solve or prevent the situation that upsets you. Find the humor in an aggravating situation. Deal with the physical effects of anger by taking deep breaths, exercising, or taking a walk. Practice letting go of whatever is making you angry. Realize that many things that may anger you are outside of your control and you cannot do anything about them. Picture a relaxing image in your mind. Close your eyes and use that image to help you calm  yourself. How to recognize anger-related problems Anger becomes a problem if it occurs frequently and lasts for long periods of time. You may also need help managing your anger if: You use physical force or aggression when you are angry and others around you feel threatened and fearful. You feel that your anger is out of control. Anger is interfering with your job. Anger is causing problems with your health. Anger  is causing problems with your relationships. Anger is affecting your ability to tolerate normal daily situations, such as sitting in a traffic jam or waiting in line. You do not trust people around you. Follow these instructions at home: Stop and think before you react. Count to 10 before you speak. Let your loved ones know you are struggling with this and that you are going to seek help. Talk to people you trust and ask them for help. They can also help you find someone who can coach you to react differently. Where to find support To find support on how to manage your anger: Call your health care provider for a referral to a mental health professional. Follow up by calling and making an appointment. Look online to find a psychologist who specializes in anger management. Look online for a local office of the Domestic Abuse Project. Your local hospital or behavioral counselors in your area may also offer anger management programs or support groups that can help. Where to find more information American Psychological Association: TVStereos.ch Substance Abuse and Mental Health Services Administration: ktimeonline.com Help Guide: www.helpguide.[place] on Domestic Violence: GreenSwimming.be Contact a health care provider if: You are unable to control your anger. You feel depressed. Your anger interferes with your ability to function at home, at work, at school, or with friends. You have physical symptoms of anger or stress such as headaches, digestion problems, or trouble sleeping. Get help right away if: You may be a danger to yourself or others. If you ever feel like you may hurt yourself or others, or have thoughts about taking your own life, get help right away. Go to your nearest emergency department or: Call your local emergency services (911 in the [place] Call a suicide crisis helpline, such as the [place] at 623-749-5051 or 988 in the [place].  This is open 24 hours a day in the [place] Text the Crisis Text Line at 640-261-2678 (in the [place].). Summary It is normal for everyone to feel angry at times. However, anger becomes a problem if it occurs frequently and lasts for long periods of time. Health and relationship problems can develop when you react too strongly to anger, act out in anger, or do not express your anger at all. You can use strategies to help you express your anger in a healthy way. Learn to change your perception. Sometimes, changing the way you think about a situation can make it seem less infuriating. Contact your health care provider or a mental health professional if you need help managing your anger. This information is not intended to replace advice given to you by your health care provider. Make sure you discuss any questions you have with your health care provider. Document Revised: 03/15/2021 Document Reviewed: 01/01/2020 Elsevier Patient Education  [place].
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
## 2022-09-21 ENCOUNTER — Telehealth: Payer: Self-pay | Admitting: Neurology

## 2022-09-21 NOTE — Telephone Encounter (Signed)
Forms completed, copy sent to scan, copy to hold. Given to front desk to collect payment.

## 2022-09-26 NOTE — Telephone Encounter (Signed)
Forms completed (received disability after completing FMLA), copy sent to scan, copy to hold. Given to front desk to collect payment.

## 2022-10-01 ENCOUNTER — Encounter: Payer: Self-pay | Admitting: Physician Assistant

## 2022-10-01 NOTE — Telephone Encounter (Signed)
Updated FMLA and Disability forms faxed with confirmation received.

## 2022-10-01 NOTE — Telephone Encounter (Signed)
Did you want me to change these dates and resubmit?

## 2022-10-03 ENCOUNTER — Encounter: Payer: Self-pay | Admitting: Physician Assistant

## 2022-10-03 NOTE — Telephone Encounter (Signed)
Pt called and states that she needs the dates on her FMLA and disability papers to reflect  10/04/22 -11/06/22

## 2022-10-12 ENCOUNTER — Other Ambulatory Visit: Payer: Self-pay | Admitting: Physician Assistant

## 2022-10-12 DIAGNOSIS — R454 Irritability and anger: Secondary | ICD-10-CM

## 2022-10-12 DIAGNOSIS — F419 Anxiety disorder, unspecified: Secondary | ICD-10-CM

## 2022-10-12 DIAGNOSIS — R4589 Other symptoms and signs involving emotional state: Secondary | ICD-10-CM

## 2022-10-12 DIAGNOSIS — R4586 Emotional lability: Secondary | ICD-10-CM

## 2022-10-17 ENCOUNTER — Ambulatory Visit (INDEPENDENT_AMBULATORY_CARE_PROVIDER_SITE_OTHER): Payer: BC Managed Care – PPO | Admitting: Professional

## 2022-10-17 ENCOUNTER — Encounter: Payer: Self-pay | Admitting: Professional

## 2022-10-17 DIAGNOSIS — F4323 Adjustment disorder with mixed anxiety and depressed mood: Secondary | ICD-10-CM | POA: Diagnosis not present

## 2022-10-17 NOTE — Progress Notes (Signed)
° ° ° ° ° ° ° ° ° ° ° ° ° ° °  Kaipo Ardis, LCMHC °

## 2022-10-17 NOTE — Addendum Note (Signed)
Addended by: Ardyth Man on: 10/17/2022 03:25 PM   Modules accepted: Level of Service
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
## 2022-10-19 ENCOUNTER — Encounter: Payer: Self-pay | Admitting: Physician Assistant

## 2022-10-19 ENCOUNTER — Telehealth (INDEPENDENT_AMBULATORY_CARE_PROVIDER_SITE_OTHER): Payer: BC Managed Care – PPO | Admitting: Physician Assistant

## 2022-10-19 DIAGNOSIS — R4589 Other symptoms and signs involving emotional state: Secondary | ICD-10-CM

## 2022-10-19 DIAGNOSIS — F419 Anxiety disorder, unspecified: Secondary | ICD-10-CM

## 2022-10-19 DIAGNOSIS — R4586 Emotional lability: Secondary | ICD-10-CM | POA: Diagnosis not present

## 2022-10-19 DIAGNOSIS — R454 Irritability and anger: Secondary | ICD-10-CM

## 2022-10-19 MED ORDER — ALPRAZOLAM 0.25 MG PO TABS: 0.2500 mg | ORAL_TABLET | Freq: Two times a day (BID) | ORAL | 0 refills | Status: DC | PRN

## 2022-10-19 MED ORDER — SERTRALINE HCL 50 MG PO TABS: ORAL_TABLET | ORAL | 0 refills | Status: DC

## 2022-10-19 NOTE — Progress Notes (Signed)
..Virtual Visit via Telephone Note  I connected with Angela Benton on 10/19/22 at  8:50 AM EST by telephone and verified that I am speaking with the correct person using two identifiers.  Location: Patient: home Provider: clinic  .Marland KitchenParticipating in visit:  Patient: Angela Benton Provider: Iran Planas PA-C    I discussed the limitations, risks, security and privacy concerns of performing an evaluation and management service by telephone and the availability of in person appointments. I also discussed with the patient that there may be a patient responsible charge related to this service. The patient expressed understanding and agreed to proceed.   History of Present Illness: Pt is a 57 yo female who calls to follow up on mood, anger, anxiety. She has been written out of work until 11/07/2022. She started counseling and that went really well. She feels like that is going to help a lot. She is taking zoloft and as needed xanax. She has 8 xanax left. She is exercising and working on good habits. No SI/HC. She is doing so much better!    Active Ambulatory Problems    Diagnosis Date Noted   Primary insomnia 10/29/2011   Herniated lumbar intervertebral disc 10/29/2011   Childhood asthma 10/29/2011   Sickle cell trait ([place]) 06/20/2012   Vitamin D deficiency 06/24/2012   Right lumbar radiculitis 02/01/2014   Dyslipidemia 04/06/2014   Abnormal weight gain 06/25/2014   Elevated blood pressure reading 12/23/2014   Overweight 03/28/2015   Liver cyst 08/27/2017   Stress at work 01/20/2019   Anxiety 01/20/2019   Constipation 07/10/2019   Bloating 06/27/2020   Lower abdominal pain 06/28/2020   Congestion of throat 12/26/2021   Body aches 12/26/2021   Outbursts of anger 09/14/2022   Depressed mood 09/14/2022   Mood changes 09/14/2022   Resolved Ambulatory Problems    Diagnosis Date Noted   Obese 07/30/2016   Class 1 obesity due to excess calories without serious comorbidity with body mass index  (BMI) of 30.0 to 30.9 in adult 07/01/2017   Past Medical History:  Diagnosis Date   History of chicken pox    Insomnia        Observations/Objective: No acute distress Normal mood  Normal breathing   Assessment and Plan: Marland KitchenMarland KitchenJaylanni was seen today for anxiety.  Diagnoses and all orders for this visit:  Anxiety -     ALPRAZolam (XANAX) 0.25 MG tablet; Take 1 tablet (0.25 mg total) by mouth 2 (two) times daily as needed for anxiety. -     sertraline (ZOLOFT) 50 MG tablet; Take one tablet daily by mouth.  Mood changes -     sertraline (ZOLOFT) 50 MG tablet; Take one tablet daily by mouth.  Outbursts of anger -     sertraline (ZOLOFT) 50 MG tablet; Take one tablet daily by mouth.  Depressed mood -     sertraline (ZOLOFT) 50 MG tablet; Take one tablet daily by mouth.   Refilled zoloft and as needed xanax no dose changes made today Continue to use xanax sparingly Continue counseling Improving with mood Written to go back to work Monday March 4th Follow up in 4 weeks    Follow Up Instructions:    I discussed the assessment and treatment plan with the patient. The patient was provided an opportunity to ask questions and all were answered. The patient agreed with the plan and demonstrated an understanding of the instructions.   The patient was advised to call back or seek an in-person evaluation if the  symptoms worsen or if the condition fails to improve as anticipated.  I provided 15 minutes of non-face-to-face time during this encounter.   Iran Planas, PA-C

## 2022-10-28 IMAGING — CT CT RENAL STONE PROTOCOL
2 of 4 series · 16 of 46 positions shown, 18 images · non-contrast
Comparison: CT abdomen and pelvis 08/27/2017.

CLINICAL DATA: Onset right lower quadrant pain this morning.
Discolored urine for 2 days.

EXAM:
CT ABDOMEN AND PELVIS WITHOUT CONTRAST
TECHNIQUE: Multidetector CT imaging of the abdomen and pelvis was performed
following the standard protocol without IV contrast.

[Series 2: axial st · axial · 0.78mm/px · z∈[-466,-36]mm · 13 of 94 slices shown, 15 images]
[im 4/94  soft-tissue]
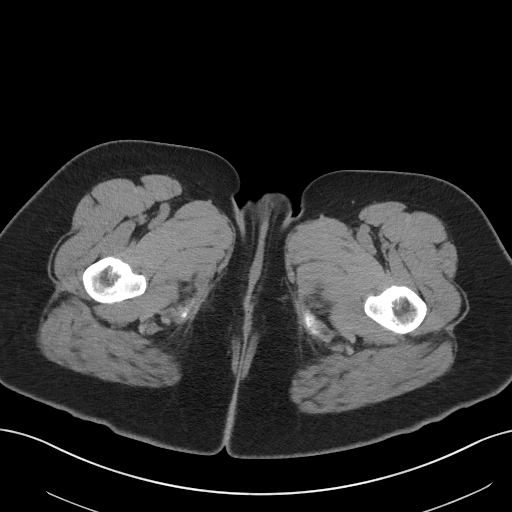
[im 4/94  bone]
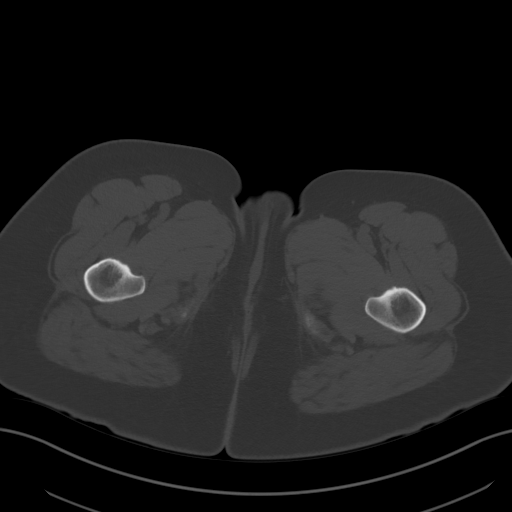
[im 12/94  soft-tissue]
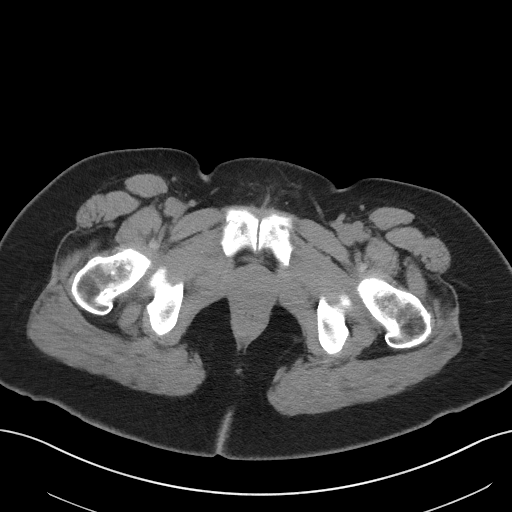
[im 20/94  soft-tissue]
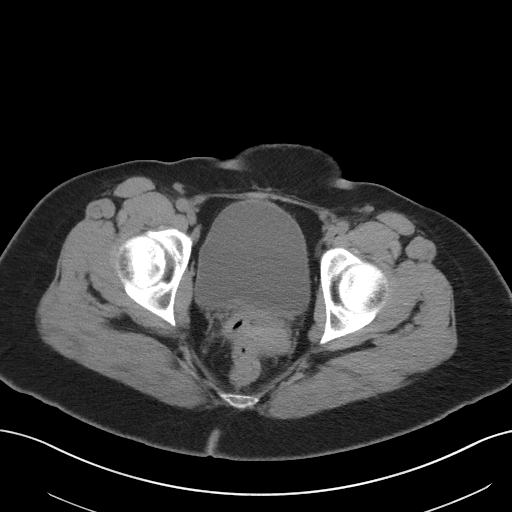
[im 28/94  soft-tissue]
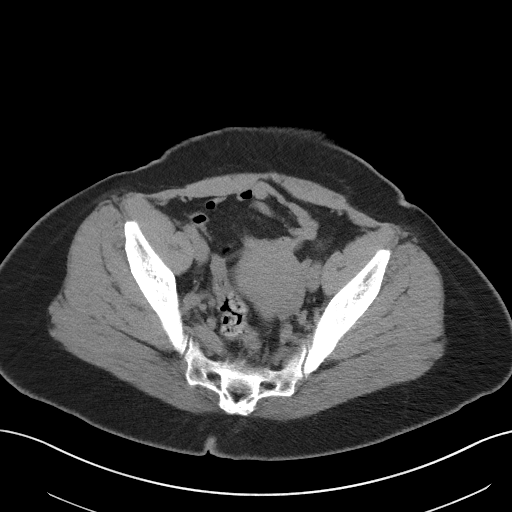
[im 32/94  soft-tissue]
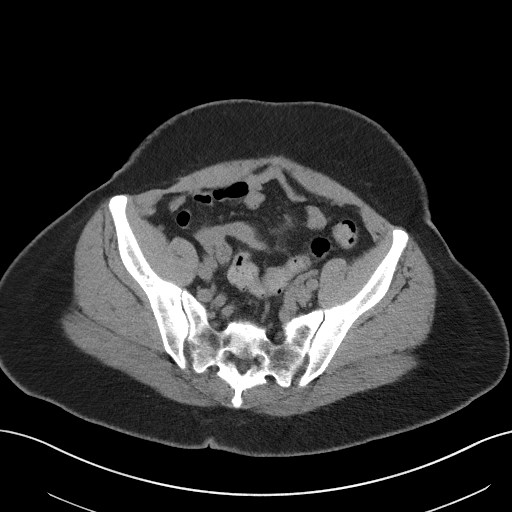
[im 39/94  soft-tissue]
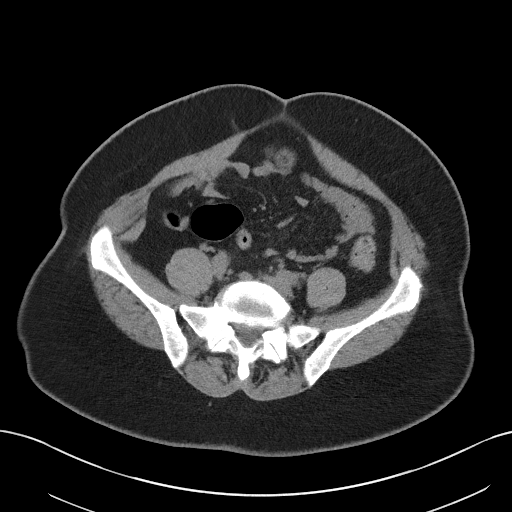
[im 47/94  soft-tissue]
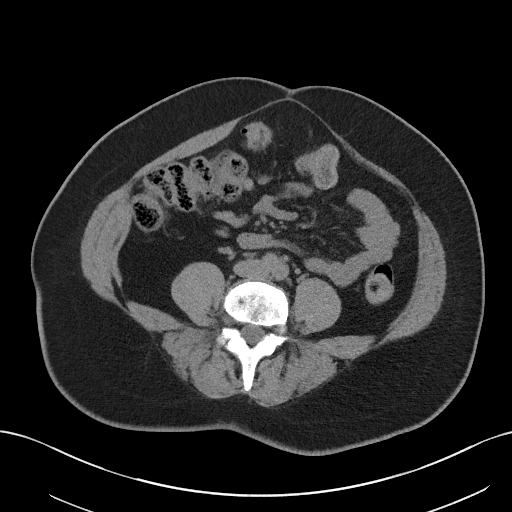
[im 55/94  soft-tissue]
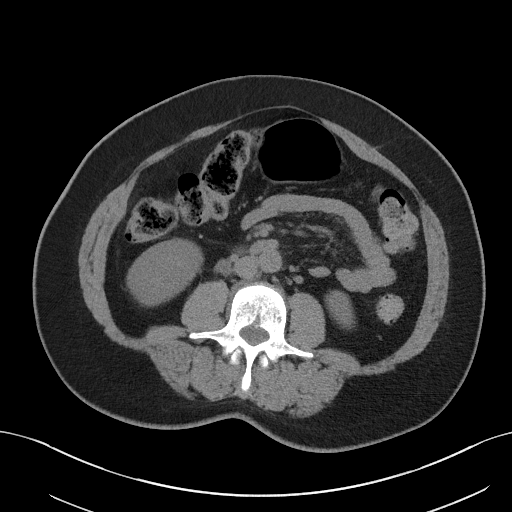
[im 63/94  soft-tissue]
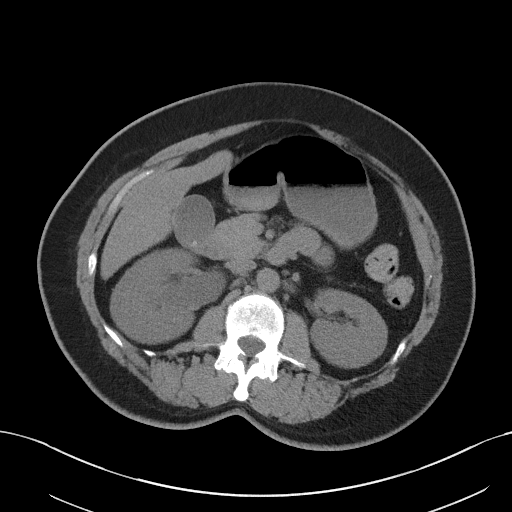
[im 63/94  bone]
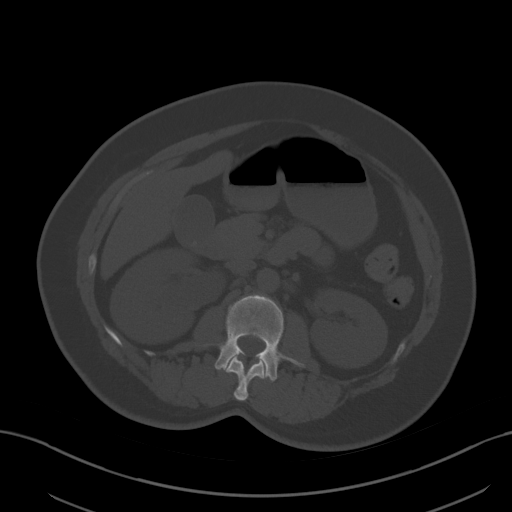
[im 66/94  soft-tissue]
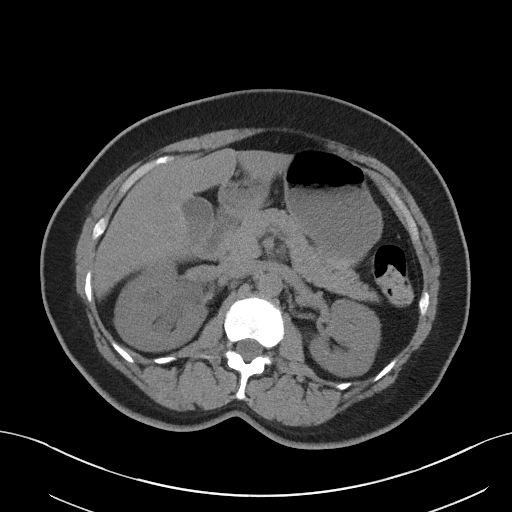
[im 74/94  soft-tissue]
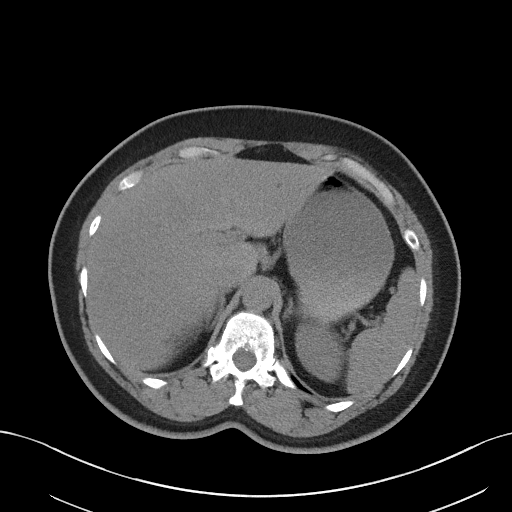
[im 82/94  soft-tissue]
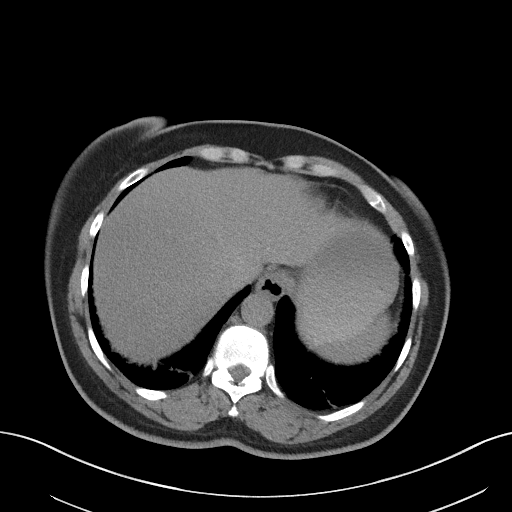
[im 90/94  soft-tissue]
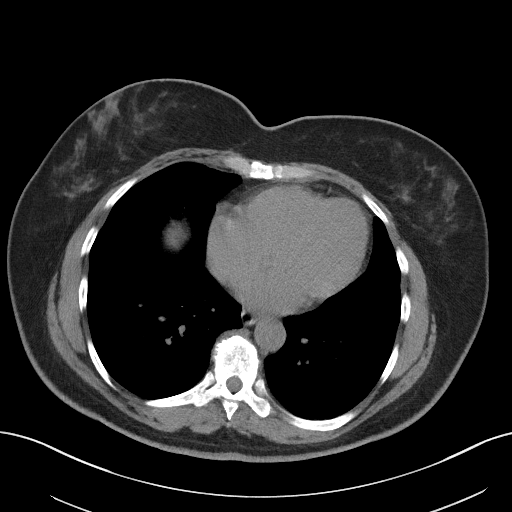

[Series 5: coronal st · coronal · 0.78mm/px · 3 of 101 slices shown]
[im 34/101  soft-tissue]
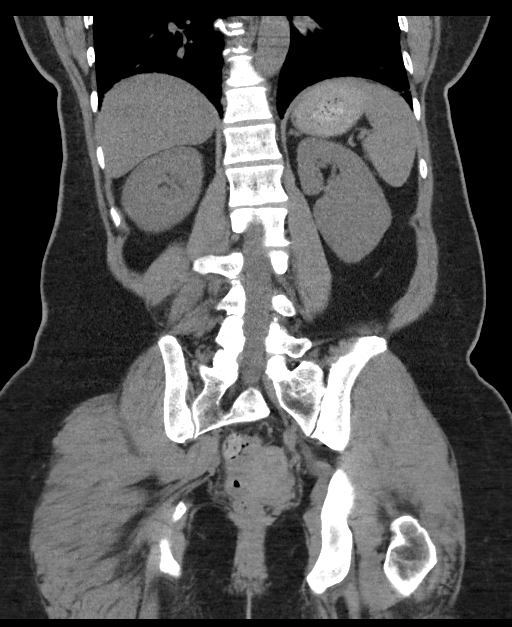
[im 45/101  soft-tissue]
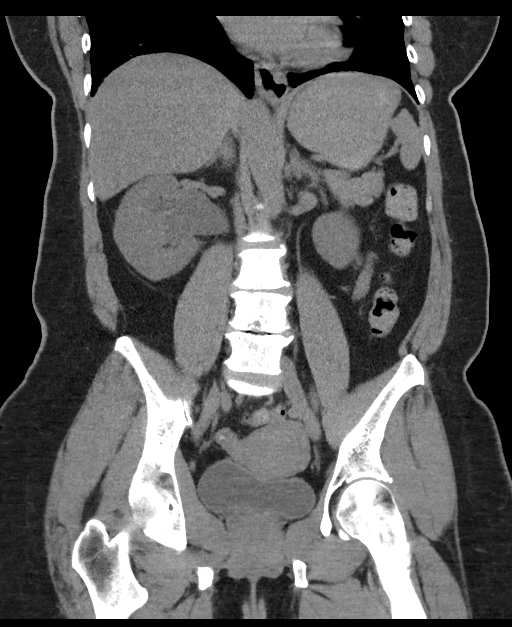
[im 56/101  soft-tissue]
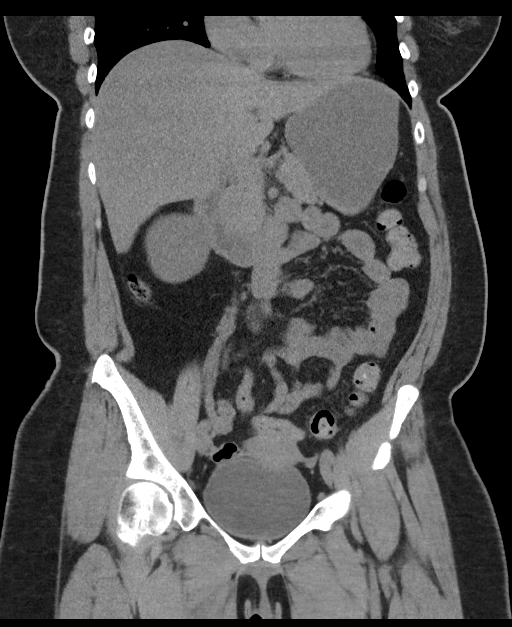

[16 of 46 positions shown; findings below may reference images not displayed]

FINDINGS: Lower chest: There is some dependent atelectasis in the lung bases.
No pleural or pericardial effusion.

Hepatobiliary: 2-3 small stones are seen in the gallbladder but
there is no CT evidence of cholecystitis. No focal liver lesion is
identified. There is diffuse fatty infiltration of the liver.
Biliary tree is negative.

Pancreas: Unremarkable. No pancreatic ductal dilatation or
surrounding inflammatory changes.

Spleen: Normal in size without focal abnormality.

Adrenals/Urinary Tract: The adrenal glands appear normal.

The patient has moderate right hydronephrosis due to a 0.4 cm stone
in the proximal right ureter. No other urinary tract stones are
identified. No focal renal lesion is seen. Next areas

Stomach/Bowel: Stomach is within normal limits. Appendix appears
normal. No evidence of bowel wall thickening, distention, or
inflammatory changes.

Vascular/Lymphatic: No significant vascular findings are present. No
enlarged abdominal or pelvic lymph nodes.

Reproductive: Uterus and bilateral adnexa are unremarkable.

Other: Laxity of the anterior abdominal wall at the midline is
unchanged.

Musculoskeletal: No acute or focal abnormality. Convex left lumbar
scoliosis and degenerative disease L3-4 and L4-5 is seen.
IMPRESSION: Moderate right hydronephrosis due to a 0.4 cm proximal right
ureteral stone. No other acute abnormality.

Fatty infiltration of the liver.

2-3 small gallstones or evidence of cholecystitis.

Degenerative disc disease L3-4 and L4-5. Mild convex left lumbar
scoliosis noted.

## 2022-11-03 IMAGING — DX DG ABDOMEN 1V
1 series · 1 of 1 positions shown · non-contrast
Comparison: 12/28/2020 abdominal CT

CLINICAL DATA: Kidney stones.  Right-sided abdominal pain

EXAM:
ABDOMEN - 1 VIEW

[abdomen kub]
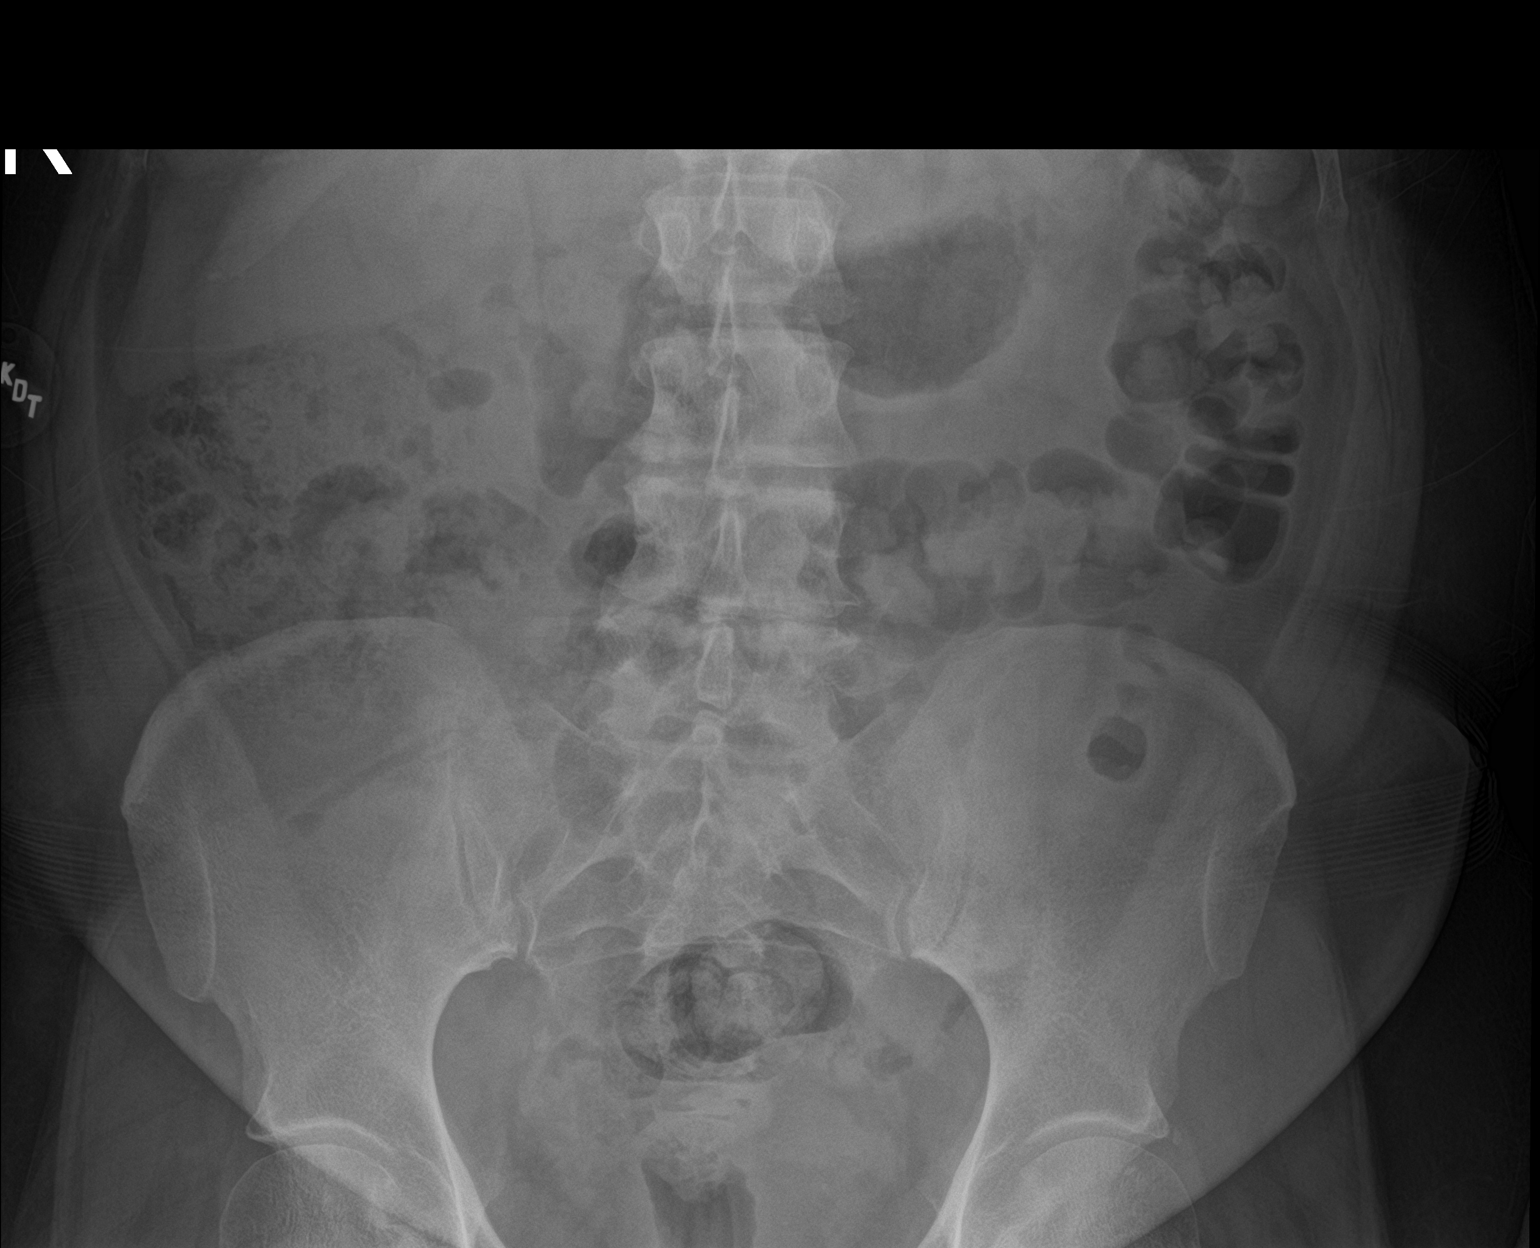

[1 of 1 positions shown; findings below may reference images not displayed]

FINDINGS: 5 mm stone over the right hemipelvis which is migrated from a more
proximal location on the prior study. No additional urolithiasis.
Normal bowel gas pattern. Lower lumbar disc degeneration
IMPRESSION: Progression of 5 mm right ureteral calculus into the right pelvic
ureter.

## 2022-11-08 ENCOUNTER — Encounter: Payer: Self-pay | Admitting: Professional

## 2022-11-08 ENCOUNTER — Ambulatory Visit: Payer: BC Managed Care – PPO | Admitting: Professional

## 2022-11-08 DIAGNOSIS — F4323 Adjustment disorder with mixed anxiety and depressed mood: Secondary | ICD-10-CM

## 2022-11-08 DIAGNOSIS — F329 Major depressive disorder, single episode, unspecified: Secondary | ICD-10-CM

## 2022-11-08 NOTE — Progress Notes (Signed)
                Sal Spratley, LCMHC 

## 2022-11-08 NOTE — Progress Notes (Addendum)
[facility] Counselor/Therapist Progress Note  Patient ID: Angela Benton, MRN: [id number],    Date: 11/08/2022  Time Spent: 53 minutes 804- 857am  Treatment Type: Individual Therapy  Risk Assessment: Danger to Self:  No Self-injurious Behavior: No Danger to Others: No  Subjective:  This session was held via video teletherapy. The patient consented to the video teletherapy and was located in her office during this session. She is aware it is the responsibility of the patient to secure confidentiality on her end of the session. The provider was in a private home office for the duration of this session.    The patient arrived on time for her webex session.  Issues addressed: 1-anxiety -has lessened -a neighbor came over and asked for help and the pt was able to step out of herself  -pt has been out of her house, has gone grocery shopping, and back to [place] -patient and Clinician completed patient's treatment plan -patient fully participated and agrees with her treatment plan.  Treatment Plan Problems Addressed  Dependency, Family Conflict, Grief / Loss Unresolved, Unipolar Depression  Goals 1. Achieve a healthy balance between independence and dependence. 2. Achieve a reasonable level of family connectedness and harmony where members support, help, and are concerned for each other. 3. Appropriately grieve the loss in order to normalize mood and to return to previously adaptive level of functioning. 4. Begin a healthy grieving process around the loss. 5. Decrease the level of present conflict with parents while beginning to let go of or resolving past conflicts with them. Objective Family members demonstrate increased openness by sharing thoughts and feelings about family dynamics, roles, and expectations. Target Date: 2023-11-07 Frequency: Biweekly  Progress: 0 Modality: individual  Related Interventions Facilitate each family member in  expressing concerns and expectations regarding becoming a more functional family unit. Objective Identify own as well as others' role in the family conflicts. Target Date: 2023-11-07 Frequency: Biweekly  Progress: 0 Modality: individual  Related Interventions Confront the client when she is not taking responsibility for her role in the family conflict and reinforce the client for owning responsibility for her contribution to the conflict. Ask the client to read material on resolving family conflict and encourage and monitor the selection of concepts to begin using in conflict resolution. Confront the client when he/she is not taking responsibility for his/her role in the family conflict and reinforce the client for owning responsibility for his/her contribution to the conflict. Objective Report an increase in resolving conflicts with parents by talking calmly and assertively rather than aggressively and defensively. Target Date: 2023-11-07 Frequency: Biweekly  Progress: 0 Modality: individual  Related Interventions Use role-playing, role reversal, modeling, and behavioral rehearsal to help the client develop assertive ways to resolve conflict with parents (recommend Your Perfect Right: Assertiveness and Equality in Your Life and Relationships by Neldon Newport). 6. Develop an awareness of how the avoidance of grieving has affected life and begin the healing process. Objective Decrease unrealistic thoughts, statements, and feelings of being responsible for the loss. Target Date: 2023-11-07 Frequency: Biweekly  Progress: 0 Modality: individual  Related Interventions Use a cognitive therapy approach to identify the client's bias toward thoughts of personal responsibility for the loss and replace them with factual, reality-based thoughts (or assign "Negative Thoughts Trigger Negative Feelings" in the Adult Psychotherapy Homework Planner by Bryn Gulling). Objective Verbalize resolution of feelings  of guilt and regret associated with the loss. Target Date: 2023-11-07 Frequency: Biweekly  Progress: 0  Modality: individual  Related Interventions Assign the client to make a list of all the regrets associated with actions toward or relationship with the deceased; process the list content toward resolution of these feelings. Objective Identify how avoiding dealing with loss has negatively impacted life. Target Date: 2023-11-07 Frequency: Biweekly  Progress: 0 Modality: individual  Related Interventions Ask the client to list ways that avoidance of grieving has negatively impacted his/her life. Objective Begin verbalizing feelings associated with the loss. Target Date: 2023-11-07 Frequency: Biweekly  Progress: 0 Modality: individual  Related Interventions Ask the client to bring pictures or mementos connected with his/her loss to a session and talk about them (or assign "Creating a [employer]" in the Adult Psychotherapy Homework Planner by Bryn Gulling). Assist the client in identifying and expressing feelings connected with his/her loss. Objective Identify what stages of grief have been experienced in the continuum of the grieving process. Target Date: 2023-11-07 Frequency: Biweekly  Progress: 0 Modality: individual  Related Interventions Ask the client to talk to several people about losses in their lives and how they felt and coped; process the findings. Educate the client on the stages of the grieving process and answer any questions he/she may have. Assist the client in identifying the stages of grief that he/she has experienced and which stage he/she is presently working through. 7. Develop confidence in capability of meeting own needs and of tolerating being alone. Objective List positive things about self. Target Date: 2023-11-07 Frequency: Biweekly  Progress: 0 Modality: individual  Related Interventions Assign the client to institute a ritual of beginning each day with 5 to  10 minutes of solitude where the focus is personal affirmation. Assist the client in developing a list of her positive attributes and accomplishments (or assign "Acknowledging My Strengths" from the Adult Psychotherapy Homework Planner by Bryn Gulling). Assist the client in developing a list of his/her positive attributes and accomplishments (or assign "Acknowledging My Strengths" from the Adult Psychotherapy Homework Planner by Bryn Gulling). Objective Identify own emotional and social needs and ways to fulfill them. Target Date: 2023-11-07 Frequency: Biweekly  Progress: 0 Modality: individual  Related Interventions Ask the client to compile a list of his/her emotional and social needs and ways that these could possibly be met; process the list (or assign "Satisfying Unmet Emotional Needs" from the Adult Psychotherapy Homework Planner by Bryn Gulling). Ask the client to list ways that she could start taking care of herself; then identify two to three that could be started now and elicit the client's agreement to do so. Monitor for follow-through and feelings of change about self. Ask the client to list ways that he/she could start taking care of himself/herself; then identify two to three that could be started now and elicit the client's agreement to do so. Monitor for follow-through and feelings of change about self. Objective Verbalize an increased awareness of boundaries and when they are violated. Target Date: 2023-11-07 Frequency: Biweekly  Progress: 0 Modality: individual  Related Interventions Assign the client to keep a daily journal regarding boundaries for taking responsibility for self and others and when she is aware of boundaries being broken by self or others. Assign the client to read the book Boundaries and process key ideas. Assign the client to read the book Boundaries: Where You End and I Begin by Belenda Cruise and process key ideas. Objective Increase the frequency of verbally clarifying  boundaries with others. Target Date: 2023-11-07 Frequency: Biweekly  Progress: 0 Modality: individual  Related Interventions Reinforce the client for implementing boundaries  and limits for self. 8. Develop healthy interpersonal relationships that lead to the alleviation and help prevent the relapse of depression. Objective Verbalize an understanding of the rationale for treatment of depression. Target Date: 2023-11-07 Frequency: Biweekly  Progress: 0 Modality: individual  Related Interventions Consistent with the treatment model, discuss how change in cognitive, behavioral, interpersonal, and other factors can help the client alleviate depression and return to previous level of effective functioning. Objective Verbalize an accurate understanding of depression. Target Date: 2023-11-07 Frequency: Biweekly  Progress: 0 Modality: individual  Related Interventions Consistent with the treatment model, discuss how cognitive, behavioral, interpersonal, and/or other factors (e.g., family history) contribute to depression. Objective Increasingly verbalize hopeful and positive statements regarding self, others, and the future. Target Date: 2023-11-07 Frequency: Biweekly  Progress: 0 Modality: individual  Related Interventions Assign the client to write at least one positive affirmation statement daily regarding himself/herself and the future (or assign "Positive Self-Talk" in the Adult Psychotherapy Homework Planner by Bryn Gulling). Teach the client more about depression and how to recognize and accept some sadness as a normal variation in feeling. Objective Learn and implement conflict resolution skills to resolve interpersonal problems. Target Date: 2023-11-07 Frequency: Biweekly  Progress: 0 Modality: individual  Related Interventions Teach conflict resolution skills (e.g., empathy, active listening, "I messages," respectful communication, assertiveness without aggression, compromise); use  psychoeducation, modeling, role-playing, and rehearsal to work through several current conflicts; assign homework exercises; review and repeat so as to integrate their use into the client's life. Help the client resolve depression related to interpersonal problems through the use of reassurance and support, clarification of cognitive and affective triggers that ignite conflicts, and active problem-solving (or assign "Applying Problem-Solving to Interpersonal Conflict" in the Adult Psychotherapy Homework Planner by Bryn Gulling). Objective Verbalize an understanding and resolution of current interpersonal problems. Target Date: 2023-11-07 Frequency: Biweekly  Progress: 0 Modality: individual  Related Interventions For grief, facilitate mourning and gradually help client discover new activities and relationships to compensate for the loss. For role transitions (e.g., beginning or ending a relationship or career, moving, promotion, retirement, graduation), help the client mourn the loss of the old role while recognizing positive and negative aspects of the new role, and taking steps to gain mastery over the new role. Objective Learn and implement behavioral strategies to overcome depression. Target Date: 2023-11-07 Frequency: Biweekly  Progress: 0 Modality: individual  Related Interventions Assist the client in developing skills that increase the likelihood of deriving pleasure from behavioral activation (e.g., assertiveness skills, developing an exercise plan, less internal/more external focus, increased social involvement); reinforce success. Engage the client in "behavioral activation," increasing his/her activity level and contact with sources of reward, while identifying processes that inhibit activation (see Behavioral Activation for Depression by Beverly Gust, Dimidjian, and Herman-Dunn; or assign "Identify and Schedule Pleasant Activities" in the Adult Psychotherapy Homework Planner by [facility]); use  behavioral techniques such as instruction, rehearsal, role-playing, role reversal, as needed, to facilitate activity in the client's daily life; reinforce success. 9. Develop healthy thinking patterns and beliefs about self, others, and the world that lead to the alleviation and help prevent the relapse of depression. 10. Enhance ability to effectively cope with the full variety of life's worries and anxieties. Objective Verbalize an understanding of the cognitive, physiological, and behavioral components of anxiety and its treatment. Target Date: 2023-11-07 Frequency: Biweekly  Progress: 0 Modality: individual  Related Interventions Discuss how generalized anxiety typically involves excessive worry about unrealistic threats, various bodily expressions of tension, overarousal, and hypervigilance, and avoidance  of what is threatening that interact to maintain the problem (see Mastery of Your Anxiety and Worry: Therapist Guide by Corky Mull, and Barlow; Treating Generalized Anxiety Disorder by Rygh and Amparo Bristol). Objective Identify, challenge, and replace biased, fearful self-talk with positive, realistic, and empowering self-talk. Target Date: 2023-11-07 Frequency: Biweekly  Progress: 0 Modality: individual  Related Interventions Explore the client's schema and self-talk that mediate his/her fear response; assist him/her in challenging the biases; replace the distorted messages with reality-based alternatives and positive, realistic self-talk that will increase his/her self-confidence in coping with irrational fears (see Cognitive Therapy of Anxiety Disorders by Alison Stalling). Objective Verbalize an understanding of the role that cognitive biases play in excessive irrational worry and persistent anxiety symptoms. Target Date: 2023-11-07 Frequency: Biweekly  Progress: 0 Modality: individual  Related Interventions Assist the client in analyzing his/her worries by examining potential biases  such as the probability of the negative expectation occurring, the real consequences of it occurring, his/her ability to control the outcome, the worst possible outcome, and his/her ability to accept it (see "Analyze the Probability of a Feared Event" in the Adult Psychotherapy Homework Planner by Bryn Gulling; Cognitive Therapy of Anxiety Disorders by Alison Stalling). Discuss examples demonstrating that unrealistic worry typically overestimates the probability of threats and underestimates or overlooks the client's ability to manage realistic demands (or assign "Past Successful Anxiety Coping" in the Adult Psychotherapy Homework Planner by [facility]). Objective Identify and engage in pleasant activities on a daily basis. Target Date: 2023-11-07 Frequency: Biweekly  Progress: 0 Modality: individual  Related Interventions Engage the client in behavioral activation, increasing the client's contact with sources of reward, identifying processes that inhibit activation, and teaching skills to solve life problems (or assign "Identify and Schedule Pleasant Activities" in the Adult Psychotherapy Homework Planner by [employer]ongs[employer]); use behavioral techniques such as instruction, rehearsal, role-playing, role reversal as needed to assist adoption into the client's daily life; reinforce success. Objective Identify the major life conflicts from the past and present that form the basis for present anxiety. Target Date: 2023-11-07 Frequency: Biweekly  Progress: 0 Modality: individual  Related Interventions Assist the client in becoming aware of key unresolved life conflicts and in starting to work toward their resolution. Reinforce the client's insights into the role of his/her past emotional pain and present anxiety. Ask the client to develop and process a list of key past and present life conflicts that continue to cause worry. Objective Learn and implement personal and interpersonal skills to reduce anxiety and improve  interpersonal relationships. Target Date: 2023-11-07 Frequency: Biweekly  Progress: 0 Modality: individual  Related Interventions Use instruction, modeling, and role-playing to build the client's general social, communication, and/or conflict resolution skills. Objective Describe situations, thoughts, feelings, and actions associated with anxieties and worries, their impact on functioning, and attempts to resolve them. Target Date: 2023-11-07 Frequency: Biweekly  Progress: 0 Modality: individual  Related Interventions Ask the client to describe his/her past experiences of anxiety and their impact on functioning; assess the focus, excessiveness, and uncontrollability of the worry and the type, frequency, intensity, and duration of his/her anxiety symptoms (consider using a structured interview such as The Anxiety Disorders Interview Schedule-Adult Version). 11. Gain a new sense of self-worth in which the substance of one's value is not attached to the capacity to do things or own things that cost money. 12. Learn and implement coping skills that result in a reduction of anxiety and worry, and improved daily functioning. 13. Reach a level of reduced tension, increased  satisfaction, and improved communication with family and/or other authority figures. 14. Resolve financial crisis with a path to eliminate debt. Objective Verbalize feelings of depression, hopelessness, and/or shame that are related to financial status. Target Date: 2023-11-07 Frequency: Biweekly  Progress: 0 Modality: individual  Related Interventions Probe the client's feelings of hopelessness or helplessness that may be associated with the financial crisis. Assess the depth or seriousness of the client's despondency over the financial crisis. Objective Isolate the sources and causes of the excessive indebtedness. Target Date: 2023-11-07 Frequency: Biweekly  Progress: 0 Modality: individual  Related Interventions Assist in  identifying, without projection of blame or holding to excuses, the causes for the financial crisis through a review of the client's history of spending. Objective Identify priorities that should control how money is spent. Target Date: 2023-11-07 Frequency: Biweekly  Progress: 0 Modality: individual  Related Interventions Ask the client to list the priorities that he/she believes should give direction to how his/her money is spent; process those priorities. Review the client's spending history to discover what priorities and values have misdirected spending. Objective Describe the family-of-origin pattern of money management. Target Date: 2023-11-07 Frequency: Biweekly  Progress: 0 Modality: individual  Related Interventions Explore the client's family-of-origin patterns of earning, saving, and spending money, focusing on how those patterns are influencing his/her current financial decisions. Objective Write a budget that balances income with expenses. Target Date: 2023-11-07 Frequency: Biweekly  Progress: 0 Modality: individual  Related Interventions If financial planning is needed, refer to a professional planner or ask partners to write a current budget and a long-range savings and investment plan (consider assigning "Plan a Budget" from the Adult Psychotherapy Homework Planner by Bryn Gulling or The Budget Kit: The Common Cents Money Management Workbook by [employer]). Review the client's budget as to reasonableness and completeness. Objective Set financial goals and make budgetary decisions with partner, allowing for equal input and balanced control over financial matters. Target Date: 2023-11-07 Frequency: Biweekly  Progress: 0 Modality: individual  Related Interventions Reinforce changes in managing money that reflect compromise, responsible planning, and respectful cooperation with the client's partner. Encourage financial planning by the client that is done in conjunction with her  partner. Objective Keep weekly and monthly records of financial income and expenses. Target Date: 2023-11-07 Frequency: Biweekly  Progress: 0 Modality: individual  Related Interventions Offer praise and ongoing encouragement of the client's progress toward debt resolution; recommend the client read The Total Money Makeover: A Proven Plan for Financial Fitness by Sherrell Puller). Encourage the client to keep a weekly and monthly record of income and outflow; review her records weekly and reinforce her responsible financial decision-making. 15. Resolve the core conflict that is the source of anxiety.  Diagnosis: Major depressive disorder, single episode with anxious distress  Plan:  -make hygiene bags for children in local school -meet again on Thursday, November 22, 2022 at 10am

## 2022-11-13 DIAGNOSIS — F329 Major depressive disorder, single episode, unspecified: Secondary | ICD-10-CM | POA: Insufficient documentation

## 2022-11-16 ENCOUNTER — Ambulatory Visit (INDEPENDENT_AMBULATORY_CARE_PROVIDER_SITE_OTHER): Payer: BC Managed Care – PPO | Admitting: Professional

## 2022-11-16 ENCOUNTER — Encounter: Payer: Self-pay | Admitting: Professional

## 2022-11-16 DIAGNOSIS — F329 Major depressive disorder, single episode, unspecified: Secondary | ICD-10-CM

## 2022-11-16 NOTE — Progress Notes (Addendum)
[facility] Counselor/Therapist Progress Note  Patient ID: Angela Benton, MRN: [id number],    Date: 11/16/2022  Time Spent: 54 minutes 1101-1155am  Treatment Type: Individual Therapy  Risk Assessment: Danger to Self:  No Self-injurious Behavior: No Danger to Others: No  Subjective:  This session was held via video teletherapy. The patient consented to the video teletherapy and was located in her office during this session. She is aware it is the responsibility of the patient to secure confidentiality on her end of the session. The provider was in a private home office for the duration of this session.    The patient arrived on time for her webex session.  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 -she watched manager call out a colleague on camera -pt was unwilling to continue and quit her job -she Armed forces operational officer in middle of night about what had happened and the company's core values -he told the team to fix it, her manager got fired 3-coping -pt can be impulsive but admits she has a conversation first -boss' reaction to the pt was triggering and pt thought "how dare you" -pt likes being boots on the ground vs management because you lose touch with the patients   -pt refuses to speak directly to her boss and will text her if she needs to communicate -pt verbalized feeling disappointment in interaction with supervisor -pt wants to  talk to her boss next week because she feels uncomfortable -pt reports she had a lot more support at [facility] -she would never speak to another boss in the way she spoke to [facility] [employer] -pt admits that she has been betrayed so much that she puts up a wall -she thought she was learning some coping mechanisms -has been taking the Zoloft -pt thinks she rushed her return due to the money -she thinks now it would have been more helpful to have more sessions with Clinician 3-medication prescribed in January 2024 -has not had medication increase since initial dosing -suggested discussing with PA [place] [employer] -pt thinks she needs to request more time off work  Treatment Plan Problems Addressed  Anger Control Problems, Anxiety, Financial Stress, Social Anxiety  Goals 1. Achieve an inner strength to control personal impulses, cravings, and desires that directly or indirectly increase debt irresponsibly. 2. Demonstrate respect for others and their feelings. 3. Develop an awareness of angry thoughts, feelings, and actions, clarifying origins of, and learning alternatives to aggressive anger. 4. Develop the ability to form relationships that will enhance recovery support system. Objective Participate in gradual repeated exposure to feared social situations within and outside of therapy. Target Date: 2023-11-07 Frequency: Biweekly  Progress: 0 Modality: individual  Related Interventions Direct and assist the client in construction of a hierarchy of anxiety-producing situations associated with the phobic response. Select initial in vivo or role-played exposures that have a high likelihood of being a successful experience for the client; do  cognitive restructuring within and after the exposure, use behavioral strategies (e.g., modeling, rehearsal, social reinforcement) to facilitate progress through the hierarchy (see Cognitive-Behavioral Group Therapy for Social Phobia by Lattie Haw; Managing Social  Anxiety by [place], Janus Molder, and [place]). Objective Verbalize and demonstrate an understanding and resolution of current interpersonal problems. Target Date: 2023-11-07 Frequency: Biweekly  Progress: 0 Modality: individual  Related Interventions For interpersonal disputes, help the client explore the relationship, the nature of the dispute, whether it has reached an impasse, and available options to resolve it including learning and implementing conflict-resolution skills; if the relationship has reached an impasse, consider ways to change the impasse or to end the relationship. Objective Verbally describe the defense mechanisms used to avoid close relationships. Target Date: 2023-11-07 Frequency: Biweekly  Progress: 0 Modality: individual  Related Interventions Assist the client in identifying defense mechanisms that keep others at a distance and prevent him/her from developing trusting relationships; identify ways to minimize defensiveness. Objective Verbalize an understanding of the rationale for cognitive-behavioral treatment of social anxiety. Target Date: 2023-11-07 Frequency: Biweekly  Progress: 0 Modality: individual  Related Interventions Discuss how therapy based on cognitive-behavioral principles targets fear and avoidance to desensitize learned fear, build social skills, reality-test anxious thoughts, and increase confidence and social effectiveness. Objective Learn and implement calming and coping strategies to manage anxiety symptoms during moments of social anxiety and lead to a more relaxed state in general. Target Date: 2023-11-07 Frequency: Biweekly  Progress: 0 Modality: individual  Related Interventions Teach and ask the client to practice relaxation and attentional focusing skills (e.g., staying focused externally and on behavioral goals, muscular relaxation, evenly paced diaphragmatic breathing, ride the wave of anxiety) for managing social anxiety symptoms and maintaining a  more relaxed approach to life; review, reinforce successes; provide corrective feedback toward effective use. 5. Develop the essential social skills that will enhance the quality of relationship life. 6. Gain a new sense of self-worth in which the substance of one's value is not attached to the capacity to do things or own things that cost money. 7. Implement cognitive behavioral skills necessary to solve problems in a more constructive manner. 8. Increase respectful communication through the use of assertiveness and conflict resolution skills. 9. Interact socially without undue fear or anxiety. 10. Learn and implement anger management skills to reduce the level of anger and irritability that accompanies it. Objective Verbalize increased awareness of anger expression patterns, their causes, and their consequences. Target Date: 2023-11-07 Frequency: Biweekly  Progress: 0 Modality: individual  Related Interventions Assist the client in re-conceptualizing anger as involving different dimensions (cognitive, physiological, affective, and behavioral) that interact predictably (e.g., demanding expectations not being met leading to increased arousal and anger leading to acting out) and that can be understood, challenged, and changed. Process the client's list of anger triggers and other relevant journal information toward helping the client understand how cognitive, physiological, and affective factors interplay to produce anger. Ask the client to list and discuss ways anger has negatively impacted his/her daily life (e.g., hurting others or self, legal conflicts, loss of respect from self and others, destruction of property); process this list. Assist the client in identifying the positive consequences of managing anger (e.g., respect from others and self, cooperation from others, improved physical health, etc.) (or assign "Alternatives to Destructive Anger" in the Adult Psychotherapy Homework Planner by  Bryn Gulling). Objective Verbalize an understanding of how the treatment is designed to decrease anger and improve the quality of life. Target Date: 2023-11-07 Frequency: Biweekly  Progress: 0 Modality: individual  Related Interventions Discuss the rationale for treatment, emphasizing how functioning can be improved through change in the various dimensions of anger; revisit relevant themes throughout therapy to help the client consolidate his/her understanding. Objective Learn and implement calming and coping strategies as part of an overall approach to managing anger. Target Date: 2023-11-07 Frequency: Biweekly  Progress: 0 Modality: individual  Related Interventions Teach the client calming techniques (e.g., progressive muscle relaxation, breathing induced relaxation, calming imagery, cue-controlled relaxation, applied relaxation, mindful breathing) as part of a tailored strategy for reducing chronic and acute physiological tension that accompanies the escalation of his/her angry feelings. Objective Learn and implement thought-stopping to manage intrusive unwanted thoughts that trigger anger. Target Date: 2023-11-07 Frequency: Biweekly  Progress: 0 Modality: individual  Related Interventions Assign the client to implement a "thought-stopping" technique in which he/she shouts STOP to himself/herself in his/her mind and then replaces the thought with an alternative that is calming (or assign "Making Use of the Thought-Stopping Technique" in the Adult Psychotherapy Homework Planner by [facility]); review implantation, reinforcing success and providing corrective feedback for failure. Objective Verbalize an understanding of relapse prevention and the difference between a lapse and relapse. Target Date: 2023-11-07 Frequency: Biweekly  Progress: 0 Modality: individual  Related Interventions Discuss with the client the distinction between a lapse and relapse, associating a lapse with an initial and  reversible angry outburst and relapse with the choice to return routinely to the old pattern of anger. Objective Identify the advantages and disadvantages of holding on to anger and of forgiveness; discuss with therapist. Target Date: 2023-11-07 Frequency: Biweekly  Progress: 0 Modality: individual  Related Interventions Discuss with the client forgiveness of the perpetrators of pain as a process of letting go of his/her anger. 11. Learn and implement coping skills that result in a reduction of anxiety and worry, and improved daily functioning. 12. Reach a personal balance between solitary time and interpersonal interaction with others. 13. Reduce overall frequency, intensity, and duration of the anxiety so that daily functioning is not impaired. Objective Learn to accept limitations in life and commit to tolerating, rather than avoiding, unpleasant emotions while accomplishing meaningful goals. Target Date: 2023-11-07 Frequency: Biweekly  Progress: 0 Modality: individual  Related Interventions Use techniques from Acceptance and Commitment Therapy to help client accept uncomfortable realities such as lack of complete control, imperfections, and uncertainty and tolerate unpleasant emotions and thoughts in order to accomplish value-consistent goals. Objective Maintain involvement in work, family, and social activities. Target Date: 2023-11-07 Frequency: Biweekly  Progress: 0 Modality: individual  Related Interventions Support the client in following through with work, family, and social activities rather than escaping or avoiding them to focus on anxiety. Objective Learn and implement calming skills to reduce overall anxiety and manage anxiety symptoms. Target Date: 2023-11-07 Frequency: Biweekly  Progress: 0 Modality: individual  Related Interventions Teach the client calming/relaxation skills (e.g., applied relaxation, progressive muscle relaxation, cue controlled relaxation; mindful  breathing; biofeedback) and how to discriminate better between relaxation and tension; teach the client how to apply these skills to his/her daily life (e.g., New Directions in Progressive Muscle Relaxation by Casper Harrison, and Hazlett-Stevens; Treating Generalized Anxiety Disorder by Rygh and Amparo Bristol). Assign the client to read about progressive muscle relaxation and other calming strategies in relevant books or treatment manuals (e.g., Progressive Relaxation Training by Gwynneth Aliment and Dani Gobble; Mastery of Your Anxiety and Worry: Workbook by Beckie Busing). Objective Identify, challenge, and replace biased, fearful self-talk with positive, realistic, and empowering  self-talk. Target Date: 2023-11-07 Frequency: Biweekly  Progress: 0 Modality: individual  Related Interventions Explore the client's schema and self-talk that mediate his/her fear response; assist him/her in challenging the biases; replace the distorted messages with reality-based alternatives and positive, realistic self-talk that will increase his/her self-confidence in coping with irrational fears (see Cognitive Therapy of Anxiety Disorders by Alison Stalling). Objective Learn and implement problem-solving strategies for realistically addressing worries. Target Date: 2023-11-07 Frequency: Biweekly  Progress: 0 Modality: individual  Related Interventions Teach the client problem-solving strategies involving specifically defining a problem, generating options for addressing it, evaluating the pros and cons of each option, selecting and implementing an optional action, and reevaluating and refining the action (or assign "Applying Problem-Solving to Interpersonal Conflict" in the Adult Psychotherapy Homework Planner by Bryn Gulling). 14. Resolve financial crisis with a path to eliminate debt. 15. Resolve the core conflict that is the source of anxiety. 16. Revise spending patterns to not exceed income. Objective Isolate the sources  and causes of the excessive indebtedness. Target Date: 2023-11-07 Frequency: Biweekly  Progress: 0 Modality: individual  Related Interventions Assist in identifying, without projection of blame or holding to excuses, the causes for the financial crisis through a review of the client's history of spending. Objective Verbalize feelings of depression, hopelessness, and/or shame that are related to financial status. Target Date: 2023-11-07 Frequency: Biweekly  Progress: 0 Modality: individual  Related Interventions Probe the client's feelings of hopelessness or helplessness that may be associated with the financial crisis. Objective Write a budget that balances income with expenses. Target Date: 2023-11-07 Frequency: Biweekly  Progress: 0 Modality: individual  Related Interventions If financial planning is needed, refer to a professional planner or ask partners to write a current budget and a long-range savings and investment plan (consider assigning "Plan a Budget" from the Adult Psychotherapy Homework Planner by Bryn Gulling or The Budget Kit: The Common [employer]ents Money Management Workbook by [employer]). Review the client's budget as to reasonableness and completeness. Objective Verbalize a plan for seeking employment to raise level of income. Target Date: 2023-11-07 Frequency: Biweekly  Progress: 0 Modality: individual  Related Interventions Review the client's income from employment and brainstorm ways (e.g., additional part-time employment, better paying job, job training) to increase this revenue. Objective Keep weekly and monthly records of financial income and expenses. Target Date: 2023-11-07 Frequency: Biweekly  Progress: 0 Modality: individual  Related Interventions Encourage the client to keep a weekly and monthly record of income and outflow; review his/her records weekly, and reinforce his/her responsible financial decision-making. Offer praise and ongoing encouragement of the client's  progress toward debt resolution; recommend the client read The Total Money Makeover: A Proven Plan for Financial Fitness by Sherrell Puller). 32. Understand personal desires, insecurities, and anxieties that make overspending possible.  Diagnosis: Major depressive disorder, single episode with anxious distress  Plan:  -pt plans to speak with supervisor about her (pt's) insubordinate behavior -meet again on Thursday, November 22, 2022 at 10am

## 2022-11-22 ENCOUNTER — Ambulatory Visit: Payer: BC Managed Care – PPO | Admitting: Professional

## 2022-11-27 ENCOUNTER — Encounter: Payer: Self-pay | Admitting: Physician Assistant

## 2022-11-29 ENCOUNTER — Encounter: Payer: Self-pay | Admitting: Professional

## 2022-11-29 ENCOUNTER — Ambulatory Visit (INDEPENDENT_AMBULATORY_CARE_PROVIDER_SITE_OTHER): Payer: BC Managed Care – PPO | Admitting: Professional

## 2022-11-29 DIAGNOSIS — F329 Major depressive disorder, single episode, unspecified: Secondary | ICD-10-CM | POA: Diagnosis not present

## 2022-11-29 NOTE — Progress Notes (Addendum)
[facility] Counselor/Therapist Progress Note  Patient ID: Angela Benton, MRN: [id number],    Date: 11/29/2022  Time Spent: 51 minutes 4-451pm  Treatment Type: Individual Therapy  Risk Assessment: Danger to Self:  No Self-injurious Behavior: No Danger to Others: No  Subjective:  This session was held via video teletherapy. The patient consented to the video teletherapy and was located in her office during this session. She is aware it is the responsibility of the patient to secure confidentiality on her end of the session. The provider was in a private home office for the duration of this session.    The patient arrived on time for her webex session.  Issues addressed: 1-homework- completed -pt plans to speak with supervisor Lexine Baton about her (pt's) insubordinate behavior -spoke to supervisor about her behavior 2-professional -work has bee going well -there has been some other issues but pt is not allowing it to bother her -pt does have issues with her being forgetful -pt chalking up to [place] having a lot on her -pt talking about having an open relationship where they could agree to disagree -her supervisor allowed her to change her hours to [phone] and may do some over time on weekend 3-mood -notices a lot of improvement from when she first began treatment    11/29/2022    4:08 PM 10/19/2022    8:36 AM 10/17/2022   11:22 AM  Depression screen PHQ 2/9  Decreased Interest 1 3 3   Down, Depressed, Hopeless 1 3 3   PHQ - 2 Score 2 6 6   Altered sleeping 1 0 3  Tired, decreased energy 1 1 3   Change in appetite 1 1 3   Feeling bad or failure about yourself  1 1 3   Trouble concentrating 0 1 2  Moving slowly or fidgety/restless 0 0 3  Suicidal thoughts 0 0 0  PHQ-9 Score 6 10 23   Difficult doing work/chores Somewhat difficult Somewhat difficult Extremely dIfficult   GAD-7 Previously scored a 6 and today scores a 5 on her  4-budgeting -discussed and created monthly  budget that provides patient money for savings  Treatment Plan Problems Addressed  Anger Control Problems, Anxiety, Financial Stress, Social Anxiety  Goals 1. Achieve an inner strength to control personal impulses, cravings, and desires that directly or indirectly increase debt irresponsibly. 2. Demonstrate respect for others and their feelings. 3. Develop an awareness of angry thoughts, feelings, and actions, clarifying origins of, and learning alternatives to aggressive anger. 4. Develop the ability to form relationships that will enhance recovery support system. Objective Participate in gradual repeated exposure to feared social situations within and outside of therapy. Target Date: 2023-11-07 Frequency: Biweekly  Progress: 0 Modality: individual  Related Interventions Direct and assist the client in construction of a hierarchy of anxiety-producing situations associated with the phobic response. Select initial in vivo or role-played exposures that have a high likelihood of being a successful experience for the client; do cognitive restructuring within and after the exposure, use behavioral strategies (e.g., modeling, rehearsal, social reinforcement) to facilitate progress through the hierarchy (see Cognitive-Behavioral Group Therapy for Social Phobia by Lattie Haw; Managing Social Anxiety by Jayton, Janus Molder, and [place]man). Objective Verbalize and demonstrate an understanding and resolution of current interpersonal problems. Target Date: 2023-11-07 Frequency: Biweekly  Progress: 0 Modality: individual  Related Interventions For interpersonal disputes, help the client explore the relationship, the nature of the dispute, whether it has reached an impasse, and available options to resolve it including learning and implementing conflict-resolution  skills; if the relationship has reached an impasse, consider ways to change the impasse or to end the relationship. Objective Verbally describe  the defense mechanisms used to avoid close relationships. Target Date: 2023-11-07 Frequency: Biweekly  Progress: 0 Modality: individual  Related Interventions Assist the client in identifying defense mechanisms that keep others at a distance and prevent him/her from developing trusting relationships; identify ways to minimize defensiveness. Objective Verbalize an understanding of the rationale for cognitive-behavioral treatment of social anxiety. Target Date: 2023-11-07 Frequency: Biweekly  Progress: 0 Modality: individual  Related Interventions Discuss how therapy based on cognitive-behavioral principles targets fear and avoidance to desensitize learned fear, build social skills, reality-test anxious thoughts, and increase confidence and social effectiveness. Objective Learn and implement calming and coping strategies to manage anxiety symptoms during moments of social anxiety and lead to a more relaxed state in general. Target Date: 2023-11-07 Frequency: Biweekly  Progress: 0 Modality: individual  Related Interventions Teach and ask the client to practice relaxation and attentional focusing skills (e.g., staying focused externally and on behavioral goals, muscular relaxation, evenly paced diaphragmatic breathing, ride the wave of anxiety) for managing social anxiety symptoms and maintaining a more relaxed approach to life; review, reinforce successes; provide corrective feedback toward effective use. 5. Develop the essential social skills that will enhance the quality of relationship life. 6. Gain a new sense of self-worth in which the substance of one's value is not attached to the capacity to do things or own things that cost money. 7. Implement cognitive behavioral skills necessary to solve problems in a more constructive manner. 8. Increase respectful communication through the use of assertiveness and conflict resolution skills. 9. Interact socially without undue fear or anxiety. 10.  Learn and implement anger management skills to reduce the level of anger and irritability that accompanies it. Objective Verbalize increased awareness of anger expression patterns, their causes, and their consequences. Target Date: 2023-11-07 Frequency: Biweekly  Progress: 0 Modality: individual  Related Interventions Assist the client in re-conceptualizing anger as involving different dimensions (cognitive, physiological, affective, and behavioral) that interact predictably (e.g., demanding expectations not being met leading to increased arousal and anger leading to acting out) and that can be understood, challenged, and changed. Process the client's list of anger triggers and other relevant journal information toward helping the client understand how cognitive, physiological, and affective factors interplay to produce anger. Ask the client to list and discuss ways anger has negatively impacted his/her daily life (e.g., hurting others or self, legal conflicts, loss of respect from self and others, destruction of property); process this list. Assist the client in identifying the positive consequences of managing anger (e.g., respect from others and self, cooperation from others, improved physical health, etc.) (or assign "Alternatives to Destructive Anger" in the Adult Psychotherapy Homework Planner by Bryn Gulling). Objective Verbalize an understanding of how the treatment is designed to decrease anger and improve the quality of life. Target Date: 2023-11-07 Frequency: Biweekly  Progress: 0 Modality: individual  Related Interventions Discuss the rationale for treatment, emphasizing how functioning can be improved through change in the various dimensions of anger; revisit relevant themes throughout therapy to help the client consolidate his/her understanding. Objective Learn and implement calming and coping strategies as part of an overall approach to managing anger. Target Date: 2023-11-07 Frequency:  Biweekly  Progress: 0 Modality: individual  Related Interventions Teach the client calming techniques (e.g., progressive muscle relaxation, breathing induced relaxation, calming imagery, cue-controlled relaxation, applied relaxation, mindful breathing) as part of a tailored strategy for  reducing chronic and acute physiological tension that accompanies the escalation of his/her angry feelings. Objective Learn and implement thought-stopping to manage intrusive unwanted thoughts that trigger anger. Target Date: 2023-11-07 Frequency: Biweekly  Progress: 0 Modality: individual  Related Interventions Assign the client to implement a "thought-stopping" technique in which he/she shouts STOP to himself/herself in his/her mind and then replaces the thought with an alternative that is calming (or assign "Making Use of the Thought-Stopping Technique" in the Adult Psychotherapy Homework Planner by [facility]); review implantation, reinforcing success and providing corrective feedback for failure. Objective Verbalize an understanding of relapse prevention and the difference between a lapse and relapse. Target Date: 2023-11-07 Frequency: Biweekly  Progress: 0 Modality: individual  Related Interventions Discuss with the client the distinction between a lapse and relapse, associating a lapse with an initial and reversible angry outburst and relapse with the choice to return routinely to the old pattern of anger. Objective Identify the advantages and disadvantages of holding on to anger and of forgiveness; discuss with therapist. Target Date: 2023-11-07 Frequency: Biweekly  Progress: 0 Modality: individual  Related Interventions Discuss with the client forgiveness of the perpetrators of pain as a process of letting go of his/her anger. 11. Learn and implement coping skills that result in a reduction of anxiety and worry, and improved daily functioning. 12. Reach a personal balance between solitary time and  interpersonal interaction with others. 13. Reduce overall frequency, intensity, and duration of the anxiety so that daily functioning is not impaired. Objective Learn to accept limitations in life and commit to tolerating, rather than avoiding, unpleasant emotions while accomplishing meaningful goals. Target Date: 2023-11-07 Frequency: Biweekly  Progress: 0 Modality: individual  Related Interventions Use techniques from Acceptance and Commitment Therapy to help client accept uncomfortable realities such as lack of complete control, imperfections, and uncertainty and tolerate unpleasant emotions and thoughts in order to accomplish value-consistent goals. Objective Maintain involvement in work, family, and social activities. Target Date: 2023-11-07 Frequency: Biweekly  Progress: 0 Modality: individual  Related Interventions Support the client in following through with work, family, and social activities rather than escaping or avoiding them to focus on anxiety. Objective Learn and implement calming skills to reduce overall anxiety and manage anxiety symptoms. Target Date: 2023-11-07 Frequency: Biweekly  Progress: 0 Modality: individual  Related Interventions Teach the client calming/relaxation skills (e.g., applied relaxation, progressive muscle relaxation, cue controlled relaxation; mindful breathing; biofeedback) and how to discriminate better between relaxation and tension; teach the client how to apply these skills to his/her daily life (e.g., New Directions in Progressive Muscle Relaxation by Casper Harrison, and Hazlett-Stevens; Treating Generalized Anxiety Disorder by Rygh and Amparo Bristol). Assign the client to read about progressive muscle relaxation and other calming strategies in relevant books or treatment manuals (e.g., Progressive Relaxation Training by Gwynneth Aliment and Dani Gobble; Mastery of Your Anxiety and Worry: Workbook by Beckie Busing). Objective Identify, challenge, and  replace biased, fearful self-talk with positive, realistic, and empowering self-talk. Target Date: 2023-11-07 Frequency: Biweekly  Progress: 0 Modality: individual  Related Interventions Explore the client's schema and self-talk that mediate his/her fear response; assist him/her in challenging the biases; replace the distorted messages with reality-based alternatives and positive, realistic self-talk that will increase his/her self-confidence in coping with irrational fears (see Cognitive Therapy of Anxiety Disorders by Alison Stalling). Objective Learn and implement problem-solving strategies for realistically addressing worries. Target Date: 2023-11-07 Frequency: Biweekly  Progress: 0 Modality: individual  Related Interventions Teach the client problem-solving strategies involving specifically defining a problem,  generating options for addressing it, evaluating the pros and cons of each option, selecting and implementing an optional action, and reevaluating and refining the action (or assign "Applying Problem-Solving to Interpersonal Conflict" in the Adult Psychotherapy Homework Planner by Bryn Gulling). 14. Resolve financial crisis with a path to eliminate debt. 15. Resolve the core conflict that is the source of anxiety. 16. Revise spending patterns to not exceed income. Objective Isolate the sources and causes of the excessive indebtedness. Target Date: 2023-11-07 Frequency: Biweekly  Progress: 0 Modality: individual  Related Interventions Assist in identifying, without projection of blame or holding to excuses, the causes for the financial crisis through a review of the client's history of spending. Objective Verbalize feelings of depression, hopelessness, and/or shame that are related to financial status. Target Date: 2023-11-07 Frequency: Biweekly  Progress: 0 Modality: individual  Related Interventions Probe the client's feelings of hopelessness or helplessness that may be associated with  the financial crisis. Objective Write a budget that balances income with expenses. Target Date: 2023-11-07 Frequency: Biweekly  Progress: 0 Modality: individual  Related Interventions If financial planning is needed, refer to a professional planner or ask partners to write a current budget and a long-range savings and investment plan (consider assigning "Plan a Budget" from the Adult Psychotherapy Homework Planner by Bryn Gulling or The Budget Kit: The [employer] Money Management Workbook by [employer]). Review the client's budget as to reasonableness and completeness. Objective Verbalize a plan for seeking employment to raise level of income. Target Date: 2023-11-07 Frequency: Biweekly  Progress: 0 Modality: individual  Related Interventions Review the client's income from employment and brainstorm ways (e.g., additional part-time employment, better paying job, job training) to increase this revenue. Objective Keep weekly and monthly records of financial income and expenses. Target Date: 2023-11-07 Frequency: Biweekly  Progress: 0 Modality: individual  Related Interventions Encourage the client to keep a weekly and monthly record of income and outflow; review his/her records weekly, and reinforce his/her responsible financial decision-making. Offer praise and ongoing encouragement of the client's progress toward debt resolution; recommend the client read The Total Money Makeover: A Proven Plan for Financial Fitness by Sherrell Puller). 92. Understand personal desires, insecurities, and anxieties that make overspending possible.  Diagnosis: Major depressive disorder, single episode with anxious distress  Plan:  -move to bi-weekly appointments -meet again on Monday, December 10, 2022 at 3pm

## 2022-12-13 ENCOUNTER — Other Ambulatory Visit: Payer: Self-pay | Admitting: Physician Assistant

## 2022-12-13 ENCOUNTER — Encounter: Payer: Self-pay | Admitting: Physician Assistant

## 2022-12-13 DIAGNOSIS — F5101 Primary insomnia: Secondary | ICD-10-CM

## 2022-12-13 DIAGNOSIS — M5416 Radiculopathy, lumbar region: Secondary | ICD-10-CM

## 2022-12-13 MED ORDER — CYCLOBENZAPRINE HCL 10 MG PO TABS: ORAL_TABLET | ORAL | 0 refills | Status: DC

## 2022-12-24 ENCOUNTER — Ambulatory Visit: Payer: BC Managed Care – PPO | Admitting: Professional

## 2023-01-07 ENCOUNTER — Ambulatory Visit (INDEPENDENT_AMBULATORY_CARE_PROVIDER_SITE_OTHER): Payer: BC Managed Care – PPO | Admitting: Professional

## 2023-01-07 ENCOUNTER — Encounter: Payer: Self-pay | Admitting: Professional

## 2023-01-07 DIAGNOSIS — F329 Major depressive disorder, single episode, unspecified: Secondary | ICD-10-CM

## 2023-01-07 NOTE — Progress Notes (Signed)
[facility] Counselor/Therapist Progress Note  Patient ID: Angela Benton, MRN: [id number],    Date: 01/07/2023  Time Spent: 25 minutes [id number]  Treatment Type: Individual Therapy  Risk Assessment: Danger to Self:  No Self-injurious Behavior: No Danger to Others: No  Subjective:  This session was held via video teletherapy. The patient consented to the video teletherapy and was located in her office during this session. She is aware it is the responsibility of the patient to secure confidentiality on her end of the session. The provider was in a private home office for the duration of this session.    The patient arrived late for her Caregility session due to connectivity issues she was experiencing.  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 Achieve an inner strength to control personal impulses, cravings, and desires that directly or indirectly increase debt irresponsibly. 2. Demonstrate respect for others and their feelings. 3. Develop an awareness of angry thoughts, feelings, and actions, clarifying origins of, and learning alternatives to aggressive anger. 4. Develop the ability to form relationships that will enhance recovery support  system. Objective Participate in gradual repeated exposure to feared social situations within and outside of therapy. Target Date: 2023-11-07 Frequency: Biweekly  Progress: 75 Modality: individual  Related Interventions Direct and assist the client in construction of a hierarchy of anxiety-producing situations associated with the phobic response. Select initial in vivo or role-played exposures that have a high likelihood of being a successful experience for the client; do cognitive restructuring within and after the exposure, use behavioral strategies (e.g., modeling, rehearsal, social reinforcement) to facilitate progress through the hierarchy (see Cognitive-Behavioral Group Therapy for Social Phobia by Rosana Hoes; Managing Social Anxiety by Columbia, Laurena Bering, and Dandridge). Objective Verbalize and demonstrate an understanding and resolution of current interpersonal problems. Target Date: 2023-11-07 Frequency: Biweekly  Progress: 75 Modality: individual  Related Interventions For interpersonal disputes, help the client explore the relationship, the nature of the dispute, whether it has reached an impasse, and available options to resolve it including learning and implementing conflict-resolution skills; if the relationship has reached an impasse, consider ways to change the impasse or to end the relationship. Objective Verbally describe the defense mechanisms used to avoid close relationships. Target Date: 2023-11-07 Frequency: Biweekly  Progress: 60 Modality: individual  Related Interventions Assist the client in identifying defense mechanisms that keep others at a distance and prevent him/her from developing trusting relationships; identify ways to minimize defensiveness. Objective Verbalize an understanding of the rationale for cognitive-behavioral treatment of social anxiety. Target Date: 2023-11-07 Frequency: Biweekly  Progress: 70 Modality: individual  Related Interventions Discuss  how therapy based on cognitive-behavioral principles targets fear and avoidance to desensitize learned fear, build social skills, reality-test anxious thoughts, and increase confidence and social effectiveness. Objective Learn and implement calming and coping strategies to manage anxiety symptoms during moments of  social anxiety and lead to a more relaxed state in general. Target Date: 2023-11-07 Frequency: Biweekly  Progress: 80 Modality: individual  Related Interventions Teach and ask the client to practice relaxation and attentional focusing skills (e.g., staying focused externally and on behavioral goals, muscular relaxation, evenly paced diaphragmatic breathing, ride the wave of anxiety) for managing social anxiety symptoms and maintaining a more relaxed approach to life; review, reinforce successes; provide corrective feedback toward effective use. 5. Develop the essential social skills that will enhance the quality of relationship life. 6. Gain a new sense of self-worth in which the substance of one's value is not attached to the capacity to do things or own things that cost money. 7. Implement cognitive behavioral skills necessary to solve problems in a more constructive manner. 8. Increase respectful communication through the use of assertiveness and conflict resolution skills. 9. Interact socially without undue fear or anxiety. 10. Learn and implement anger management skills to reduce the level of anger and irritability that accompanies it. Objective Verbalize increased awareness of anger expression patterns, their causes, and their consequences. Target Date: 2023-11-07 Frequency: Biweekly  Progress: 80 Modality: individual  Related Interventions Assist the client in re-conceptualizing anger as involving different dimensions (cognitive, physiological, affective, and behavioral) that interact predictably (e.g., demanding expectations not being met leading to increased arousal and anger  leading to acting out) and that can be understood, challenged, and changed. Process the client's list of anger triggers and other relevant journal information toward helping the client understand how cognitive, physiological, and affective factors interplay to produce anger. Ask the client to list and discuss ways anger has negatively impacted his/her daily life (e.g., hurting others or self, legal conflicts, loss of respect from self and others, destruction of property); process this list. Assist the client in identifying the positive consequences of managing anger (e.g., respect from others and self, cooperation from others, improved physical health, etc.) (or assign "Alternatives to Destructive Anger" in the Adult Psychotherapy Homework Planner by Stephannie Li). Objective Verbalize an understanding of how the treatment is designed to decrease anger and improve the quality of life. Target Date: 2023-11-07 Frequency: Biweekly  Progress: 90 Modality: individual  Related Interventions Discuss the rationale for treatment, emphasizing how functioning can be improved through change in the various dimensions of anger; revisit relevant themes throughout therapy to help the client consolidate his/her understanding. Objective Learn and implement calming and coping strategies as part of an overall approach to managing anger. Target Date: 2023-11-07 Frequency: Biweekly  Progress: 80 Modality: individual  Related Interventions Teach the client calming techniques (e.g., progressive muscle relaxation, breathing induced relaxation, calming imagery, cue-controlled relaxation, applied relaxation, mindful breathing) as part of a tailored strategy for reducing chronic and acute physiological tension that accompanies the escalation of his/her angry feelings. Objective Learn and implement thought-stopping to manage intrusive unwanted thoughts that trigger anger. Target Date: 2023-11-07 Frequency: Biweekly  Progress: 75  Modality: individual  Related Interventions Assign the client to implement a "thought-stopping" technique in which he/she shouts STOP to himself/herself in his/her mind and then replaces the thought with an alternative that is calming (or assign "Making Use of the Thought-Stopping Technique" in the Adult Psychotherapy Homework Planner by [facility]); review implantation, reinforcing success and providing corrective feedback for failure. Objective Verbalize an understanding of relapse prevention and the difference between a lapse and relapse. Target Date: 2023-11-07 Frequency: Biweekly  Progress: 75 Modality: individual  Related Interventions Discuss with the client the distinction between a lapse and relapse, associating a lapse  with an initial and reversible angry outburst and relapse with the choice to return routinely to the old pattern of anger. Objective Identify the advantages and disadvantages of holding on to anger and of forgiveness; discuss with therapist. Target Date: 2023-11-07 Frequency: Biweekly  Progress: 80 Modality: individual  Related Interventions Discuss with the client forgiveness of the perpetrators of pain as a process of letting go of his/her anger. 11. Learn and implement coping skills that result in a reduction of anxiety and worry, and improved daily functioning. 12. Reach a personal balance between solitary time and interpersonal interaction with others. 13. Reduce overall frequency, intensity, and duration of the anxiety so that daily functioning is not impaired. Objective Learn to accept limitations in life and commit to tolerating, rather than avoiding, unpleasant emotions while accomplishing meaningful goals. Target Date: 2023-11-07 Frequency: Biweekly  Progress: 80 Modality: individual  Related Interventions Use techniques from Acceptance and Commitment Therapy to help client accept uncomfortable realities such as lack of complete control, imperfections, and  uncertainty and tolerate unpleasant emotions and thoughts in order to accomplish value-consistent goals. Objective Maintain involvement in work, family, and social activities. Target Date: 2023-11-07 Frequency: Biweekly  Progress: 75 Modality: individual  Related Interventions Support the client in following through with work, family, and social activities rather than escaping or avoiding them to focus on anxiety. Objective Learn and implement calming skills to reduce overall anxiety and manage anxiety symptoms. Target Date: 2023-11-07 Frequency: Biweekly  Progress: 75 Modality: individual  Related Interventions Teach the client calming/relaxation skills (e.g., applied relaxation, progressive muscle relaxation, cue controlled relaxation; mindful breathing; biofeedback) and how to discriminate better between relaxation and tension; teach the client how to apply these skills to his/her daily life (e.g., New Directions in Progressive Muscle Relaxation by Marcelyn Ditty, and Hazlett-Stevens; Treating Generalized Anxiety Disorder by Rygh and Ida Rogue). Assign the client to read about progressive muscle relaxation and other calming strategies in relevant books or treatment manuals (e.g., Progressive Relaxation Training by Robb Matar and Alen Blew; Mastery of Your Anxiety and Worry: Workbook by Earlie Counts). Objective Identify, challenge, and replace biased, fearful self-talk with positive, realistic, and empowering self-talk. Target Date: 2023-11-07 Frequency: Biweekly  Progress: 80 Modality: individual  Related Interventions Explore the client's schema and self-talk that mediate his/her fear response; assist him/her in challenging the biases; replace the distorted messages with reality-based alternatives and positive, realistic self-talk that will increase his/her self-confidence in coping with irrational fears (see Cognitive Therapy of Anxiety Disorders by Laurence Slate). Objective Learn  and implement problem-solving strategies for realistically addressing worries. Target Date: 2023-11-07 Frequency: Biweekly  Progress: 75 Modality: individual  Related Interventions Teach the client problem-solving strategies involving specifically defining a problem, generating options for addressing it, evaluating the pros and cons of each option, selecting and implementing an optional action, and reevaluating and refining the action (or assign "Applying Problem-Solving to Interpersonal Conflict" in the Adult Psychotherapy Homework Planner by Stephannie Li). 14. Resolve financial crisis with a path to eliminate debt. 15. Resolve the core conflict that is the source of anxiety. 16. Revise spending patterns to not exceed income. Objective Isolate the sources and causes of the excessive indebtedness. Target Date: 2023-11-07 Frequency: Biweekly  Progress: 65 Modality: individual  Related Interventions Assist in identifying, without projection of blame or holding to excuses, the causes for the financial crisis through a review of the client's history of spending. Objective Verbalize feelings of depression, hopelessness, and/or shame that are related to financial status. Target Date: 2023-11-07 Frequency: Biweekly  Progress: 80 Modality: individual  Related Interventions Probe the client's feelings of hopelessness or helplessness that may be associated with the financial crisis. Objective Write a budget that balances income with expenses. Target Date: 2023-11-07 Frequency: Biweekly  Progress: 80 Modality: individual  Related Interventions If financial planning is needed, refer to a professional planner or ask partners to write a current budget and a long-range savings and investment plan (consider assigning "Plan a Budget" from the Adult Psychotherapy Homework Planner by Stephannie Li or The Budget Kit: The Common Cents Money Management Workbook by [employer]). Review the client's budget as to reasonableness  and completeness. Objective Verbalize a plan for seeking employment to raise level of income. Target Date: 2023-11-07 Frequency: Biweekly  Progress: 90 Modality: individual  Related Interventions Review the client's income from employment and brainstorm ways (e.g., additional part-time employment, better paying job, job training) to increase this revenue. Objective Keep weekly and monthly records of financial income and expenses. Target Date: 2023-11-07 Frequency: Biweekly  Progress: 85 Modality: individual  Related Interventions Encourage the client to keep a weekly and monthly record of income and outflow; review his/her records weekly, and reinforce his/her responsible financial decision-making. Offer praise and ongoing encouragement of the client's progress toward debt resolution; recommend the client read The Total Money Makeover: A Proven Plan for Financial Fitness by Reed Pandy). 17. Understand personal desires, insecurities, and anxieties that make overspending possible.  Diagnosis: Major depressive disorder, single episode with anxious distress  Plan:  -reviewed importance of staying on top of her mental health and being intentional  -move to as needed appointments

## 2023-01-21 ENCOUNTER — Ambulatory Visit: Payer: BC Managed Care – PPO | Admitting: Professional

## 2023-02-14 ENCOUNTER — Other Ambulatory Visit: Payer: Self-pay | Admitting: Physician Assistant

## 2023-02-14 DIAGNOSIS — R4589 Other symptoms and signs involving emotional state: Secondary | ICD-10-CM

## 2023-02-14 DIAGNOSIS — R454 Irritability and anger: Secondary | ICD-10-CM

## 2023-02-14 DIAGNOSIS — F419 Anxiety disorder, unspecified: Secondary | ICD-10-CM

## 2023-02-14 DIAGNOSIS — R4586 Emotional lability: Secondary | ICD-10-CM

## 2023-05-20 ENCOUNTER — Other Ambulatory Visit: Payer: Self-pay | Admitting: Physician Assistant

## 2023-05-20 DIAGNOSIS — M5416 Radiculopathy, lumbar region: Secondary | ICD-10-CM

## 2023-05-20 DIAGNOSIS — F419 Anxiety disorder, unspecified: Secondary | ICD-10-CM

## 2023-05-21 ENCOUNTER — Other Ambulatory Visit: Payer: Self-pay

## 2023-05-21 DIAGNOSIS — F5101 Primary insomnia: Secondary | ICD-10-CM

## 2023-05-21 MED ORDER — ALPRAZOLAM 0.25 MG PO TABS: 0.2500 mg | ORAL_TABLET | Freq: Two times a day (BID) | ORAL | 1 refills | Status: DC | PRN

## 2023-05-21 MED ORDER — CYCLOBENZAPRINE HCL 10 MG PO TABS: ORAL_TABLET | ORAL | 0 refills | Status: AC

## 2023-05-21 MED ORDER — ZOLPIDEM TARTRATE 10 MG PO TABS
10.0000 mg | ORAL_TABLET | Freq: Every evening | ORAL | 1 refills | Status: DC | PRN
Start: 2023-05-21 — End: 2023-11-05

## 2023-11-05 ENCOUNTER — Encounter: Payer: Self-pay | Admitting: Physician Assistant

## 2023-11-05 DIAGNOSIS — F5101 Primary insomnia: Secondary | ICD-10-CM

## 2023-11-05 MED ORDER — ZOLPIDEM TARTRATE 10 MG PO TABS
10.0000 mg | ORAL_TABLET | Freq: Every evening | ORAL | 0 refills | Status: DC | PRN
Start: 2023-11-05 — End: 2024-02-21

## 2024-02-21 ENCOUNTER — Other Ambulatory Visit: Payer: Self-pay

## 2024-02-21 DIAGNOSIS — F5101 Primary insomnia: Secondary | ICD-10-CM

## 2024-02-21 MED ORDER — ZOLPIDEM TARTRATE 10 MG PO TABS: 10.0000 mg | ORAL_TABLET | Freq: Every evening | ORAL | 0 refills | Status: DC | PRN

## 2024-02-21 NOTE — Telephone Encounter (Signed)
 Last fill 11/05/23 last visit 10/19/22 next visit 03/17/24

## 2024-03-17 ENCOUNTER — Encounter: Payer: Self-pay | Admitting: Physician Assistant

## 2024-03-17 ENCOUNTER — Ambulatory Visit (INDEPENDENT_AMBULATORY_CARE_PROVIDER_SITE_OTHER): Payer: Self-pay | Admitting: Physician Assistant

## 2024-03-17 VITALS — BP 162/98 | HR 110 | Ht 67.0 in | Wt 186.0 lb

## 2024-03-17 DIAGNOSIS — Z1211 Encounter for screening for malignant neoplasm of colon: Secondary | ICD-10-CM | POA: Insufficient documentation

## 2024-03-17 DIAGNOSIS — F419 Anxiety disorder, unspecified: Secondary | ICD-10-CM | POA: Diagnosis not present

## 2024-03-17 DIAGNOSIS — I10 Essential (primary) hypertension: Secondary | ICD-10-CM

## 2024-03-17 DIAGNOSIS — F5101 Primary insomnia: Secondary | ICD-10-CM | POA: Diagnosis not present

## 2024-03-17 DIAGNOSIS — Z1231 Encounter for screening mammogram for malignant neoplasm of breast: Secondary | ICD-10-CM

## 2024-03-17 MED ORDER — ZOLPIDEM TARTRATE 10 MG PO TABS: 10.0000 mg | ORAL_TABLET | Freq: Every evening | ORAL | 0 refills | Status: DC | PRN

## 2024-03-17 MED ORDER — AMLODIPINE BESYLATE 2.5 MG PO TABS
2.5000 mg | ORAL_TABLET | Freq: Every day | ORAL | 1 refills | Status: DC
Start: 2024-03-17 — End: 2024-04-13

## 2024-03-17 MED ORDER — QUETIAPINE FUMARATE 25 MG PO TABS
ORAL_TABLET | ORAL | 1 refills | Status: DC
Start: 2024-03-17 — End: 2024-04-07

## 2024-03-17 NOTE — Progress Notes (Deleted)
 Established Patient Office Visit  Subjective   Patient ID: Angela Benton, female    DOB: 10-25-1965  Age: 58 y.o. MRN: [id number]  Chief Complaint  Patient presents with   Medical Management of Chronic Issues     Insomnia still not better    Patient has returned for a follow-up because she is still struggling to sleep at night. Patient states that Ambien  she has been taking used to be helpful and now it is not as much.  She endorses feeling normal throughout the day and when it is time for her to go to bed, her mind starts racing and she begins to think of all of the things she needs to do. She takes ambien  at 9:30pm and sleeps until 4:30am. She says she is not taking sertraline  and has not been for a while. She endorses using xanax  as needed, about once a month. Patient admits to not checking her blood pressure at home. She states her BP must be elevated because she drank coffee this morning. She denies any chest pain, palpitations, headache, or swelling in extremities.   Review of Systems  Cardiovascular:  Negative for chest pain, palpitations and leg swelling.  Psychiatric/Behavioral:  The patient has insomnia.   All other systems reviewed and are negative.     Objective:     BP (!) 162/98 (Cuff Size: Large)   Pulse (!) 110   Ht 5' 7 (1.702 m)   Wt 186 lb (84.4 kg)   SpO2 99%   BMI 29.13 kg/m  BP Readings from Last 3 Encounters:  03/17/24 (!) 162/98  11/21/21 120/86  05/24/21 (!) 176/64      Physical Exam Vitals reviewed.  Constitutional:      Appearance: Normal appearance.  HENT:     Head: Normocephalic and atraumatic.  Cardiovascular:     Rate and Rhythm: Regular rhythm. Tachycardia present.  Pulmonary:     Effort: Pulmonary effort is normal.  Abdominal:     General: Abdomen is flat.  Musculoskeletal:        General: Normal range of motion.     Cervical back: Normal range of motion and neck supple.  Skin:    General: Skin is warm.  Neurological:      Mental Status: She is alert.        Assessment & Plan:  SABRASABRAAdriyana was seen today for medical management of chronic issues.  Diagnoses and all orders for this visit:  Primary insomnia -     QUEtiapine  (SEROQUEL ) 25 MG tablet; Take 1-2 tablets 30 minutes before bed for sleep. -     zolpidem  (AMBIEN ) 10 MG tablet; Take 1 tablet (10 mg total) by mouth at bedtime as needed. for sleep  Anxiety  Colon cancer screening -     Cologuard  Primary hypertension -     amLODipine  (NORVASC ) 2.5 MG tablet; Take 1 tablet (2.5 mg total) by mouth daily.  Visit for screening mammogram -     MM 3D SCREENING MAMMOGRAM BILATERAL BREAST; Future   Primary hypertension  - BP: 172/107 ; recheck BP: 162/98 - Start amlodipine  2.5mg  today, sent into pharmacy - Educated patient on importance of checking BP 2x daily and to start keeping a log to bring into the office at her next visit - Educated patient on DASH diet and decreasing salt intake as additional tool to help lowering her BP  - Educated patient on exercising 30 minutes daily to help decrease BP and help with weight loss  Insomnia - Seroquel  25mg  sent into pharmacy,  take 1-2 tablets as needed 30 minutes before going to bed  - Sent in a 30 day supply of Ambien  to pharmacy for back up, in case seroquel  is not helpful with sleep   Encouraged natural ashwaganda 300mg  twice a day for anxiety.   Health Maintenance-ordered mammogram and cologuard.   Return in about 4 weeks (around 04/14/2024).    Blue Winther, PA-C

## 2024-03-17 NOTE — Progress Notes (Signed)
   Established Patient Office Visit  Subjective   Patient ID: ANNET MANUKYAN, female    DOB: November 17, 1965  Age: 58 y.o. MRN: [id number]  Chief Complaint  Patient presents with   Medical Management of Chronic Issues     Insomnia still not better    HPI    ROS    Objective:     BP (!) 172/107   Pulse (!) 110   Ht 5' 7 (1.702 m)   Wt 186 lb (84.4 kg)   SpO2 99%   BMI 29.13 kg/m  BP Readings from Last 3 Encounters:  03/17/24 (!) 172/107  11/21/21 120/86  05/24/21 (!) 176/64   Wt Readings from Last 3 Encounters:  03/17/24 186 lb (84.4 kg)  11/21/21 184 lb (83.5 kg)  01/03/21 179 lb 14.3 oz (81.6 kg)      Physical Exam     Assessment & Plan:     No follow-ups on file.    Taino Maertens, PA-C

## 2024-03-17 NOTE — Patient Instructions (Signed)
 Start amlodipine  2.5mg  daily in the morning for BP.  Get BP cuff and check twice a day keep log Start seroquel  1-2 tablets at bedtime 30 tablets of Ambien  backup Follow up in 1 month  DASH Eating Plan DASH stands for Dietary Approaches to Stop Hypertension. The DASH eating plan is a healthy eating plan that has been shown to: Lower high blood pressure (hypertension). Reduce your risk for type 2 diabetes, heart disease, and stroke. Help with weight loss. What are tips for following this plan? Reading food labels Check food labels for the amount of salt (sodium) per serving. Choose foods with less than 5 percent of the Daily Value (DV) of sodium. In general, foods with less than 300 milligrams (mg) of sodium per serving fit into this eating plan. To find whole grains, look for the word whole as the first word in the ingredient list. Shopping Buy products labeled as low-sodium or no salt added. Buy fresh foods. Avoid canned foods and pre-made or frozen meals. Cooking Try not to add salt when you cook. Use salt-free seasonings or herbs instead of table salt or sea salt. Check with your health care provider or pharmacist before using salt substitutes. Do not fry foods. Cook foods in healthy ways, such as baking, boiling, grilling, roasting, or broiling. Cook using oils that are good for your heart. These include olive, canola, avocado, soybean, and sunflower oil. Meal planning  Eat a balanced diet. This should include: 4 or more servings of fruits and 4 or more servings of vegetables each day. Try to fill half of your plate with fruits and vegetables. 6-8 servings of whole grains each day. 6 or less servings of lean meat, poultry, or fish each day. 1 oz is 1 serving. A 3 oz (85 g) serving of meat is about the same size as the palm of your hand. One egg is 1 oz (28 g). 2-3 servings of low-fat dairy each day. One serving is 1 cup (237 mL). 1 serving of nuts, seeds, or beans 5 times each  week. 2-3 servings of heart-healthy fats. Healthy fats called omega-3 fatty acids are found in foods such as walnuts, flaxseeds, fortified milks, and eggs. These fats are also found in cold-water fish, such as sardines, salmon, and mackerel. Limit how much you eat of: Canned or prepackaged foods. Food that is high in trans fat, such as fried foods. Food that is high in saturated fat, such as fatty meat. Desserts and other sweets, sugary drinks, and other foods with added sugar. Full-fat dairy products. Do not salt foods before eating. Do not eat more than 4 egg yolks a week. Try to eat at least 2 vegetarian meals a week. Eat more home-cooked food and less restaurant, buffet, and fast food. Lifestyle When eating at a restaurant, ask if your food can be made with less salt or no salt. If you drink alcohol: Limit how much you have to: 0-1 drink a day if you are female. 0-2 drinks a day if you are female. Know how much alcohol is in your drink. In the [place] one drink is one 12 oz bottle of beer (355 mL), one 5 oz glass of wine (148 mL), or one 1 oz glass of hard liquor (44 mL). General information Avoid eating more than 2,300 mg of salt a day. If you have hypertension, you may need to reduce your sodium intake to 1,500 mg a day. Work with your provider to stay at a healthy body weight or  lose weight. Ask what the best weight range is for you. On most days of the week, get at least 30 minutes of exercise that causes your heart to beat faster. This may include walking, swimming, or biking. Work with your provider or dietitian to adjust your eating plan to meet your specific calorie needs. What foods should I eat? Fruits All fresh, dried, or frozen fruit. Canned fruits that are in their natural juice and do not have sugar added to them. Vegetables Fresh or frozen vegetables that are raw, steamed, roasted, or grilled. Low-sodium or reduced-sodium tomato and vegetable juice. Low-sodium or  reduced-sodium tomato sauce and tomato paste. Low-sodium or reduced-sodium canned vegetables. Grains Whole-grain or whole-wheat bread. Whole-grain or whole-wheat pasta. Brown rice. [place]. Bulgur. Whole-grain and low-sodium cereals. Pita bread. Low-fat, low-sodium crackers. Whole-wheat flour tortillas. Meats and other proteins Skinless chicken or [place]. Ground chicken or [place]. Pork with fat trimmed off. Fish and seafood. Egg whites. Dried beans, peas, or lentils. Unsalted nuts, nut butters, and seeds. Unsalted canned beans. Lean cuts of beef with fat trimmed off. Low-sodium, lean precooked or cured meat, such as sausages or meat loaves. Dairy Low-fat (1%) or fat-free (skim) milk. Reduced-fat, low-fat, or fat-free cheeses. Nonfat, low-sodium ricotta or cottage cheese. Low-fat or nonfat yogurt. Low-fat, low-sodium cheese. Fats and oils Soft margarine without trans fats. Vegetable oil. Reduced-fat, low-fat, or light mayonnaise and salad dressings (reduced-sodium). Canola, safflower, olive, avocado, soybean, and sunflower oils. Avocado. Seasonings and condiments Herbs. Spices. Seasoning mixes without salt. Other foods Unsalted popcorn and pretzels. Fat-free sweets. The items listed above may not be all the foods and drinks you can have. Talk to a dietitian to learn more. What foods should I avoid? Fruits Canned fruit in a light or heavy syrup. Fried fruit. Fruit in cream or butter sauce. Vegetables Creamed or fried vegetables. Vegetables in a cheese sauce. Regular canned vegetables that are not marked as low-sodium or reduced-sodium. Regular canned tomato sauce and paste that are not marked as low-sodium or reduced-sodium. Regular tomato and vegetable juices that are not marked as low-sodium or reduced-sodium. Dene. Olives. Grains Baked goods made with fat, such as croissants, muffins, or some breads. Dry pasta or rice meal packs. Meats and other proteins Fatty cuts of meat. Ribs.  Fried meat. [place]. Bologna, salami, and other precooked or cured meats, such as sausages or meat loaves, that are not lean and low in sodium. Fat from the back of a pig (fatback). Bratwurst. Salted nuts and seeds. Canned beans with added salt. Canned or smoked fish. Whole eggs or egg yolks. Chicken or [place] with skin. Dairy Whole or 2% milk, cream, and half-and-half. Whole or full-fat cream cheese. Whole-fat or sweetened yogurt. Full-fat cheese. Nondairy creamers. Whipped toppings. Processed cheese and cheese spreads. Fats and oils Butter. Stick margarine. Lard. Shortening. Ghee. Bacon fat. Tropical oils, such as coconut, palm kernel, or palm oil. Seasonings and condiments Onion salt, garlic salt, seasoned salt, table salt, and sea salt. Worcestershire sauce. Tartar sauce. Barbecue sauce. Teriyaki sauce. Soy sauce, including reduced-sodium soy sauce. Steak sauce. Canned and packaged gravies. Fish sauce. Oyster sauce. Cocktail sauce. Store-bought horseradish. Ketchup. Mustard. Meat flavorings and tenderizers. Bouillon cubes. Hot sauces. Pre-made or packaged marinades. Pre-made or packaged taco seasonings. Relishes. Regular salad dressings. Other foods Salted popcorn and pretzels. The items listed above may not be all the foods and drinks you should avoid. Talk to a dietitian to learn more. Where to find more information National Heart, Lung, and Blood  Institute (NHLBI): BuffaloDryCleaner.gl American Heart Association [facility]): heart.org Academy of Nutrition and Dietetics: eatright.org National Kidney Foundation (NKF): kidney.org This information is not intended to replace advice given to you by your health care provider. Make sure you discuss any questions you have with your health care provider. Document Revised: 09/06/2022 Document Reviewed: 09/06/2022 Elsevier Patient Education  2024 [employer].

## 2024-04-07 ENCOUNTER — Other Ambulatory Visit: Payer: Self-pay | Admitting: Physician Assistant

## 2024-04-07 DIAGNOSIS — Z1211 Encounter for screening for malignant neoplasm of colon: Secondary | ICD-10-CM | POA: Diagnosis not present

## 2024-04-07 NOTE — Progress Notes (Signed)
 Pt called into clinic to state seroquel  made her nauseated. She will stick with ambien .

## 2024-04-08 ENCOUNTER — Other Ambulatory Visit: Payer: Self-pay | Admitting: Physician Assistant

## 2024-04-08 DIAGNOSIS — I10 Essential (primary) hypertension: Secondary | ICD-10-CM

## 2024-04-14 ENCOUNTER — Ambulatory Visit (INDEPENDENT_AMBULATORY_CARE_PROVIDER_SITE_OTHER): Payer: Self-pay | Admitting: Physician Assistant

## 2024-04-14 ENCOUNTER — Encounter: Payer: Self-pay | Admitting: Physician Assistant

## 2024-04-14 VITALS — BP 158/88 | HR 94 | Ht 67.0 in | Wt 191.0 lb

## 2024-04-14 DIAGNOSIS — F419 Anxiety disorder, unspecified: Secondary | ICD-10-CM | POA: Diagnosis not present

## 2024-04-14 DIAGNOSIS — F5101 Primary insomnia: Secondary | ICD-10-CM

## 2024-04-14 DIAGNOSIS — I10 Essential (primary) hypertension: Secondary | ICD-10-CM | POA: Insufficient documentation

## 2024-04-14 MED ORDER — ZOLPIDEM TARTRATE 10 MG PO TABS: 10.0000 mg | ORAL_TABLET | Freq: Every evening | ORAL | 1 refills | Status: DC | PRN

## 2024-04-14 MED ORDER — AMLODIPINE BESYLATE 5 MG PO TABS
5.0000 mg | ORAL_TABLET | Freq: Every day | ORAL | 0 refills | Status: DC
Start: 2024-04-14 — End: 2024-07-17

## 2024-04-14 NOTE — Patient Instructions (Signed)
 Increase norvasc  5 mg daily

## 2024-04-14 NOTE — Progress Notes (Signed)
 Established Patient Office Visit  Subjective   Patient ID: Angela Benton, female    DOB: [birth year]  Age: 58 y.o. MRN: [id number]  Chief Complaint  Patient presents with   Medical Management of Chronic Issues    Primary insomnia/Anxiety      HPI Pt is a 58 yo female with HTN, insomnia and anxiety who presents to the clinic for HTN follow up. She is taking her norvasc  daily. She denies any headaches, vision changes, palpitations, or CP. She is not checking her BP at home. She is sleeping with ambien . Seroquel  left her feeling more drowsy. Her mood is great. She rarely takes xanax .     Review of Systems  All other systems reviewed and are negative.     Objective:     BP (!) 158/88   Pulse 94   Ht 5' 7 (1.702 m)   Wt 191 lb (86.6 kg)   SpO2 99%   BMI 29.91 kg/m  BP Readings from Last 3 Encounters:  04/14/24 (!) 158/88  03/17/24 (!) 162/98  11/21/21 120/86   Wt Readings from Last 3 Encounters:  04/14/24 191 lb (86.6 kg)  03/17/24 186 lb (84.4 kg)  11/21/21 184 lb (83.5 kg)    ..    04/14/2024    9:11 AM 03/17/2024    9:18 AM 11/29/2022    4:26 PM 10/19/2022    8:38 AM  GAD 7 : Generalized Anxiety Score  Nervous, Anxious, on Edge 0 0  1  Control/stop worrying 0 0  1  Worry too much - different things 0 0  1  Trouble relaxing 0 2  1  Restless 0 0  0  Easily annoyed or irritable 0 1  1  Afraid - awful might happen 0 0  1  Total GAD 7 Score 0 3  6  Anxiety Difficulty Not difficult at all Not difficult at all  Somewhat difficult     Information is confidential and restricted. Go to Review Flowsheets to unlock data.    ..    04/14/2024    9:11 AM 03/17/2024    9:18 AM 11/29/2022    4:08 PM 10/19/2022    8:36 AM 10/17/2022   11:22 AM  Depression screen PHQ 2/9  Decreased Interest 0 0  3   Down, Depressed, Hopeless 0 0  3   PHQ - 2 Score 0 0  6   Altered sleeping 0 0  0   Tired, decreased energy 0 0  1   Change in appetite 0 0  1   Feeling bad or failure  about yourself  0 0  1   Trouble concentrating 0 0  1   Moving slowly or fidgety/restless 0 0  0   Suicidal thoughts 0 0  0   PHQ-9 Score 0 0  10   Difficult doing work/chores Not difficult at all Not difficult at all  Somewhat difficult      Information is confidential and restricted. Go to Review Flowsheets to unlock data.     Physical Exam Constitutional:      Appearance: Normal appearance.  HENT:     Head: Normocephalic.  Neck:     Vascular: No carotid bruit.  Cardiovascular:     Rate and Rhythm: Normal rate and regular rhythm.     Pulses: Normal pulses.     Heart sounds: Normal heart sounds.  Pulmonary:     Effort: Pulmonary effort is normal.     Breath  sounds: Normal breath sounds.  Musculoskeletal:     Cervical back: Normal range of motion and neck supple.     Right lower leg: No edema.     Left lower leg: No edema.  Neurological:     Mental Status: She is alert and oriented to person, place, and time.  Psychiatric:        Mood and Affect: Mood normal.          Assessment & Plan:  Angela Benton was seen today for medical management of chronic issues.  Diagnoses and all orders for this visit:  Primary hypertension -     amLODipine  (NORVASC ) 5 MG tablet; Take 1 tablet (5 mg total) by mouth daily.  Primary insomnia -     zolpidem  (AMBIEN ) 10 MG tablet; Take 1 tablet (10 mg total) by mouth at bedtime as needed. for sleep  Anxiety   BP not to goal, a little better on 2nd recheck Start checking BP at home at least twice a day and keep log Increase norvasc  to 5mg  daily Continue to avoid sodium and aim for 150 minutes of exercise a week Report readings via mychart Goal BP under 140/90  PHQ/GAD to goal Not had xanax  refilled since last year uses if very rarely Consider ashwaganda for stress and anxiety  Insomnia controlled with ambien    Angela Jaeger, PA-C

## 2024-04-16 ENCOUNTER — Ambulatory Visit

## 2024-04-16 LAB — COLOGUARD: COLOGUARD: NEGATIVE

## 2024-04-17 ENCOUNTER — Ambulatory Visit: Payer: Self-pay | Admitting: Physician Assistant

## 2024-04-17 NOTE — Progress Notes (Signed)
 Negative cologuard screening. Re-screen in 3 years.

## 2024-05-14 ENCOUNTER — Ambulatory Visit

## 2024-06-26 ENCOUNTER — Encounter: Payer: Self-pay | Admitting: Physician Assistant

## 2024-06-26 DIAGNOSIS — F419 Anxiety disorder, unspecified: Secondary | ICD-10-CM

## 2024-06-30 MED ORDER — ALPRAZOLAM 0.25 MG PO TABS: 0.2500 mg | ORAL_TABLET | Freq: Two times a day (BID) | ORAL | 2 refills | Status: AC | PRN

## 2024-07-16 ENCOUNTER — Encounter: Payer: Self-pay | Admitting: Urgent Care

## 2024-07-16 ENCOUNTER — Ambulatory Visit: Admitting: Urgent Care

## 2024-07-16 ENCOUNTER — Ambulatory Visit

## 2024-07-16 ENCOUNTER — Ambulatory Visit: Payer: Self-pay | Admitting: Urgent Care

## 2024-07-16 VITALS — BP 190/110 | HR 100 | Ht 67.0 in | Wt 198.0 lb

## 2024-07-16 DIAGNOSIS — I10 Essential (primary) hypertension: Secondary | ICD-10-CM

## 2024-07-16 DIAGNOSIS — R7309 Other abnormal glucose: Secondary | ICD-10-CM

## 2024-07-16 DIAGNOSIS — G510 Bell's palsy: Secondary | ICD-10-CM

## 2024-07-16 DIAGNOSIS — Z6831 Body mass index (BMI) 31.0-31.9, adult: Secondary | ICD-10-CM | POA: Diagnosis not present

## 2024-07-16 DIAGNOSIS — R531 Weakness: Secondary | ICD-10-CM | POA: Diagnosis not present

## 2024-07-16 MED ORDER — VALACYCLOVIR HCL 1 G PO TABS: 1000.0000 mg | ORAL_TABLET | Freq: Three times a day (TID) | ORAL | 0 refills | Status: AC

## 2024-07-16 MED ORDER — PREDNISONE 20 MG PO TABS: 60.0000 mg | ORAL_TABLET | Freq: Every day | ORAL | 0 refills | Status: AC

## 2024-07-16 MED ORDER — CHLORTHALIDONE 25 MG PO TABS: 25.0000 mg | ORAL_TABLET | Freq: Every day | ORAL | 0 refills | Status: AC

## 2024-07-16 NOTE — Progress Notes (Unsigned)
   Established Patient Office Visit  Subjective:  Patient ID: Angela Benton, female    DOB: Jan 19, 1966  Age: 58 y.o. MRN: [id number]  Chief Complaint  Patient presents with  . Facial Swelling    Eye droop and mouth draw x 1 week, seen at [place] [facility]    HPI  {History (Optional):23778}  ROS: as noted in HPI  Objective:     BP (!) 180/106   Pulse 100   Ht 5' 7 (1.702 m)   Wt 198 lb (89.8 kg)   SpO2 97%   BMI 31.01 kg/m  BP Readings from Last 3 Encounters:  07/16/24 (!) 180/106  04/14/24 (!) 158/88  03/17/24 (!) 162/98   Wt Readings from Last 3 Encounters:  07/16/24 198 lb (89.8 kg)  04/14/24 191 lb (86.6 kg)  03/17/24 186 lb (84.4 kg)      Physical Exam   No results found for any visits on 07/16/24.  {Labs (Optional):23779}  The ASCVD Risk score (Arnett DK, et al., 2019) failed to calculate for the following reasons:   Cannot find a previous HDL lab   Cannot find a previous total cholesterol lab  Assessment & Plan:  Facial paralysis/Bells palsy     No follow-ups on file.   [name], PA

## 2024-07-16 NOTE — Patient Instructions (Signed)
 Go to [address] to obtain CT Scan head. We will call you with results. We will discuss treatment after results obtained.  Please start taking your amlodipine  daily. I have added chlorthalidone. You can take both together every morning.  Follow up in 5 days.

## 2024-07-17 ENCOUNTER — Other Ambulatory Visit: Payer: Self-pay | Admitting: Physician Assistant

## 2024-07-17 DIAGNOSIS — I10 Essential (primary) hypertension: Secondary | ICD-10-CM

## 2024-07-17 LAB — CMP14+EGFR
ALT: 30 IU/L (ref 0–32)
AST: 26 IU/L (ref 0–40)
Albumin: 4.4 g/dL (ref 3.8–4.9)
Alkaline Phosphatase: 130 IU/L (ref 49–135)
BUN/Creatinine Ratio: 14 (ref 9–23)
BUN: 10 mg/dL (ref 6–24)
Bilirubin Total: 0.7 mg/dL (ref 0.0–1.2)
CO2: 24 mmol/L (ref 20–29)
Calcium: 9.6 mg/dL (ref 8.7–10.2)
Chloride: 103 mmol/L (ref 96–106)
Creatinine, Ser: 0.73 mg/dL (ref 0.57–1.00)
Globulin, Total: 3.7 g/dL (ref 1.5–4.5)
Glucose: 63 mg/dL — ABNORMAL LOW (ref 70–99)
Potassium: 4 mmol/L (ref 3.5–5.2)
Sodium: 142 mmol/L (ref 134–144)
Total Protein: 8.1 g/dL (ref 6.0–8.5)
eGFR: 96 mL/min/1.73 (ref >59)

## 2024-07-17 LAB — CBC WITH DIFFERENTIAL/PLATELET
Basophils Absolute: 0 x10E3/uL (ref 0.0–0.2)
Basos: 1 %
EOS (ABSOLUTE): 0.1 x10E3/uL (ref 0.0–0.4)
Eos: 2 %
Hematocrit: 45.1 % (ref 34.0–46.6)
Hemoglobin: 14.9 g/dL (ref 11.1–15.9)
Immature Grans (Abs): 0 x10E3/uL (ref 0.0–0.1)
Immature Granulocytes: 0 %
Lymphocytes Absolute: 2.8 x10E3/uL (ref 0.7–3.1)
Lymphs: 40 %
MCH: 27.1 pg (ref 26.6–33.0)
MCHC: 33 g/dL (ref 31.5–35.7)
MCV: 82 fL (ref 79–97)
Monocytes Absolute: 0.7 x10E3/uL (ref 0.1–0.9)
Monocytes: 9 %
Neutrophils Absolute: 3.3 x10E3/uL (ref 1.4–7.0)
Neutrophils: 48 %
Platelets: 227 x10E3/uL (ref 150–450)
RBC: 5.5 x10E6/uL — ABNORMAL HIGH (ref 3.77–5.28)
RDW: 15.3 % (ref 11.7–15.4)
WBC: 7 x10E3/uL (ref 3.4–10.8)

## 2024-07-17 LAB — LIPID PANEL
Chol/HDL Ratio: 5 ratio — ABNORMAL HIGH (ref 0.0–4.4)
Cholesterol, Total: 223 mg/dL — ABNORMAL HIGH (ref 100–199)
HDL: 45 mg/dL (ref >39)
LDL Chol Calc (NIH): 150 mg/dL — ABNORMAL HIGH (ref 0–99)
Triglycerides: 156 mg/dL — ABNORMAL HIGH (ref 0–149)
VLDL Cholesterol Cal: 28 mg/dL (ref 5–40)

## 2024-07-17 LAB — TSH+FREE T4
Free T4: 0.92 ng/dL (ref 0.82–1.77)
TSH: 1.18 u[IU]/mL (ref 0.450–4.500)

## 2024-07-17 LAB — HEMOGLOBIN A1C
Est. average glucose Bld gHb Est-mCnc: 114 mg/dL
Hgb A1c MFr Bld: 5.6 % (ref 4.8–5.6)

## 2024-07-17 MED ORDER — AMLODIPINE BESYLATE 5 MG PO TABS: 5.0000 mg | ORAL_TABLET | Freq: Every day | ORAL | 0 refills | Status: AC

## 2024-07-23 ENCOUNTER — Encounter: Payer: Self-pay | Admitting: Urgent Care

## 2024-07-23 ENCOUNTER — Ambulatory Visit (INDEPENDENT_AMBULATORY_CARE_PROVIDER_SITE_OTHER): Admitting: Urgent Care

## 2024-07-23 VITALS — BP 144/80 | HR 84 | Ht 67.0 in | Wt 190.0 lb

## 2024-07-23 DIAGNOSIS — E785 Hyperlipidemia, unspecified: Secondary | ICD-10-CM | POA: Diagnosis not present

## 2024-07-23 DIAGNOSIS — I10 Essential (primary) hypertension: Secondary | ICD-10-CM | POA: Diagnosis not present

## 2024-07-23 DIAGNOSIS — G510 Bell's palsy: Secondary | ICD-10-CM

## 2024-07-23 DIAGNOSIS — Z6829 Body mass index (BMI) 29.0-29.9, adult: Secondary | ICD-10-CM | POA: Diagnosis not present

## 2024-07-23 NOTE — Patient Instructions (Addendum)
 GREAT WORK!!!  I am impressed with your lifestyle changes, weight loss and blood pressure.  Please continue to monitor the blood pressure - it will likely continue to drop after completing the prednisone .  Please follow up with neurology, referral placed today.  Your current ASCVD risk score (change of cardiovascular event) is 11%.  Dietary Recommendations for Elevated LDL Cholesterol  Elevated low-density lipoprotein (LDL) cholesterol is a significant risk factor for cardiovascular disease. The following dietary guidelines aim to help lower LDL levels and support overall heart health.  Foods to Emphasize 1. High-Fiber Foods Soluble fiber can reduce LDL cholesterol by binding to it in the digestive tract. Aim for at least 10-25 grams of soluble fiber per day.  Examples: Oats and oat bran Barley Legumes (beans, lentils, chickpeas) Apples, oranges, pears, and berries Carrots, Brussels sprouts  2. Healthy Fats Replace saturated and trans fats with unsaturated fats. Monounsaturated fats: olive oil, avocado, nuts (almonds, peanuts) Polyunsaturated fats: fatty fish (salmon, sardines), walnuts, flaxseeds, sunflower seeds  3. Plant Sterols and Stanols Naturally found in small amounts in plant-based foods. Also available in fortified products like certain margarines, orange juice, or yogurts. Aim for 2 grams per day to help lower LDL.  4. Whole Grains Choose whole grains over refined grains. Examples: brown rice, quinoa, whole wheat, bulgur, farro  5. Fruits and Vegetables Aim for at least 5 servings per day. Provide fiber, antioxidants, and nutrients that support cardiovascular health.  6. Lean Protein Sources Skinless poultry, fish, legumes, tofu Include fatty fish (like salmon, tuna, or sardines) at least twice per week for omega-3s.  Foods to Limit or Avoid 1. Saturated Fats Limit to less than 5-6% of total daily calories. Sources to avoid or limit: Fatty cuts of red  meat Full-fat dairy (butter, cheese, cream) Coconut oil, palm oil Processed meats (bacon, sausage)  2. Trans Fats Avoid entirely. Often found in: Processed baked goods (cookies, pastries, crackers) Fried fast foods Margarine or shortening (check labels for partially hydrogenated oils)  3. Dietary Cholesterol While less impactful than saturated/trans fats, still best to limit. Found in egg yolks, shellfish, and organ meats. Limit to <200 mg per day if LDL is high and you're at cardiovascular risk.  4. Added Sugars and Refined Carbs Limit sugary drinks, desserts, white bread, and processed snacks. These can contribute to poor lipid profiles and weight gain.  You may also want to consider fish oil 1000mg  daily with EPA/ DHA and also resveratrol.   Follow up in 2 months for routine checkup. Come FASTING

## 2024-07-23 NOTE — Progress Notes (Signed)
 Established Patient Office Visit  Subjective:  Patient ID: Angela Benton, female    DOB: 01-29-1966  Age: 58 y.o. MRN: [id number]  Chief Complaint  Patient presents with   Follow-up    Some heart racing with medications and making diet changes    HPI  Discussed the use of AI scribe software for clinical note transcription with the patient, who gave verbal consent to proceed.  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  She continues to experience facial nerve palsy, with difficulty fully closing her eyes and asymmetry in her smile, particularly on the left side. She drinks on the stronger side and feels her condition is improving. Today marks the last day of her prednisone  and Valtrex course. CT scan performed last visit was unremarkable.  She has experienced heart palpitations, which she suspects are related to prednisone  use. Her blood pressure has improved significantly. She takes amlodipine  at night and chlorthalidone in the morning.  She has adopted a Mediterranean-style diet and takes supplements like probiotics, magnesium , vitamin sprout, pumpkin seed oil, and tea. She has lost weight, feels better overall, and engages in more physical activity, including walking and considering dance classes. She created a Meet[employer] group for social activities.  She reports occasional constipation, likely due to her medications, but notes improvement. No issues with dry eyes or infection, though she experiences some morning watering. No new concerns at this time.   We did review labs from last visit.       Patient Active Problem List   Diagnosis Date Noted   Primary hypertension 04/14/2024   Colon cancer screening 03/17/2024   Major depressive disorder, single episode with anxious distress 11/13/2022   Outbursts of anger 09/14/2022   Depressed mood 09/14/2022   Mood  changes 09/14/2022   Congestion of throat 12/26/2021   Body aches 12/26/2021   Lower abdominal pain 06/28/2020   Bloating 06/27/2020   Constipation 07/10/2019   Stress at work 01/20/2019   Anxiety 01/20/2019   Liver cyst 08/27/2017   Overweight 03/28/2015   Elevated blood pressure reading 12/23/2014   Abnormal weight gain 06/25/2014   Dyslipidemia 04/06/2014   Right lumbar radiculitis 02/01/2014   Vitamin D  deficiency 06/24/2012   Sickle cell trait 06/20/2012   Primary insomnia 10/29/2011   Herniated lumbar intervertebral disc 10/29/2011   Childhood asthma 10/29/2011   Past Medical History:  Diagnosis Date   Herniated lumbar intervertebral disc    L4 and L5   History of chicken pox    Insomnia    Sickle cell trait    Past Surgical History:  Procedure Laterality Date   CESAREAN SECTION     Social History   Tobacco Use   Smoking status: Never   Smokeless tobacco: Never  Substance Use Topics   Alcohol use: Yes    Comment: Rarely   Drug use: No      ROS: as noted in HPI  Objective:     BP (!) 144/80   Pulse 84   Ht 5' 7 (1.702 m)   Wt 190 lb (86.2 kg)   SpO2 97%   BMI 29.76 kg/m  BP Readings from Last 3 Encounters:  07/23/24 (!) 144/80  07/16/24 (!) 190/110  04/14/24 (!) 158/88   Wt Readings from Last 3 Encounters:  07/23/24 190 lb (86.2 kg)  07/16/24 198 lb (89.8 kg)  04/14/24 191 lb (86.6 kg)      Physical Exam Vitals and nursing note reviewed. Exam conducted with a chaperone present.  Constitutional:      General: She is not in acute distress.    Appearance: Normal appearance. She is normal weight. She is not ill-appearing, toxic-appearing or diaphoretic.  HENT:     Head: Normocephalic and atraumatic.     Right Ear: Tympanic membrane, ear canal and external ear normal.     Left Ear: Tympanic membrane, ear canal and external ear normal.     Nose: Nose normal. No congestion or rhinorrhea.     Mouth/Throat:     Lips: Pink.     Mouth: Mucous  membranes are moist.     Pharynx: Oropharynx is clear. No oropharyngeal exudate or posterior oropharyngeal erythema.  Eyes:     General: No visual field deficit or scleral icterus.       Right eye: No discharge.        Left eye: No discharge.     Extraocular Movements: Extraocular movements intact.     Pupils: Pupils are equal, round, and reactive to light.  Cardiovascular:     Rate and Rhythm: Normal rate and regular rhythm.     Heart sounds: Normal heart sounds. No murmur heard. Pulmonary:     Effort: Pulmonary effort is normal. No respiratory distress.     Breath sounds: Normal breath sounds. No stridor.  Musculoskeletal:     Cervical back: Normal range of motion and neck supple. No rigidity or tenderness.  Lymphadenopathy:     Cervical: No cervical adenopathy.  Skin:    General: Skin is warm and dry.     Coloration: Skin is not jaundiced.     Findings: No bruising, lesion or rash.  Neurological:     General: No focal deficit present.     Mental Status: She is alert and oriented to person, place, and time. Mental status is at baseline.     Cranial Nerves: Facial asymmetry present. No cranial nerve deficit or dysarthria.     Sensory: Sensation is intact. No sensory deficit.     Motor: Motor function is intact. No weakness, tremor, atrophy, seizure activity or pronator drift.     Coordination: Coordination is intact. Romberg sign negative. Coordination normal. Finger-Nose-Finger Test and Heel to [facility] Test normal. Rapid alternating movements normal.     Gait: Gait is intact. Gait and tandem walk normal.     Comments: Near-complete paralysis on L side of face with difficulty closing L eye, minimally improved from previous visit.  Psychiatric:        Mood and Affect: Mood normal.        Behavior: Behavior normal.      No results found for any visits on 07/23/24.  Last CBC Lab Results  Component Value Date   WBC 7.0 07/16/2024   HGB 14.9 07/16/2024   HCT 45.1 07/16/2024    MCV 82 07/16/2024   MCH 27.1 07/16/2024   RDW 15.3 07/16/2024   PLT 227 07/16/2024   Last metabolic panel Lab Results  Component Value Date   GLUCOSE 63 (L) 07/16/2024   NA 142 07/16/2024   K 4.0 07/16/2024   CL 103 07/16/2024   CO2 24 07/16/2024   BUN 10 07/16/2024   CREATININE 0.73 07/16/2024   EGFR 96 07/16/2024   CALCIUM 9.6 07/16/2024   PROT 8.1 07/16/2024   ALBUMIN 4.4 07/16/2024   LABGLOB 3.7 07/16/2024   BILITOT 0.7 07/16/2024  ALKPHOS 130 07/16/2024   AST 26 07/16/2024   ALT 30 07/16/2024   ANIONGAP 9 01/03/2021   Last lipids Lab Results  Component Value Date   CHOL 223 (H) 07/16/2024   HDL 45 07/16/2024   LDLCALC 150 (H) 07/16/2024   TRIG 156 (H) 07/16/2024   CHOLHDL 5.0 (H) 07/16/2024   Last hemoglobin A1c Lab Results  Component Value Date   HGBA1C 5.6 07/16/2024   Last thyroid  functions Lab Results  Component Value Date   TSH 1.180 07/16/2024   FREET4 0.92 07/16/2024   Last vitamin D  Lab Results  Component Value Date   VD25OH 20 (L) 06/20/2012   Last vitamin B12 and Folate Lab Results  Component Value Date   VITAMINB12 405 06/20/2012   FOLATE 16.9 06/20/2012      The 10-year ASCVD risk score (Arnett DK, et al., 2019) is: 10.6%  Assessment & Plan:  Bell's palsy -     Ambulatory referral to Neurology  Primary hypertension  BMI 29.0-29.9,adult  Dyslipidemia  Assessment and Plan    Bell's palsy, left side Persistent left-sided Bell's palsy with incomplete eye closure and asymmetrical smile. No significant improvement despite treatment. Discussed potential for incomplete recovery and importance of early treatment. - Referred to neurology for further evaluation and management. - Advised use of over-the-counter lubricating drops and taping eyelid closed if dry eye symptoms develop.  Essential hypertension Blood pressure improved with amlodipine  and chlorthalidone. Prednisone  may affect regimen. Discussed potential medication timing  adjustment post-prednisone . - Continue current antihypertensive regimen. - Monitor blood pressure after discontinuation of prednisone . - Consider adjusting medication timing to morning if blood pressure remains elevated post-prednisone .  Overweight, adult BMI 29.0-29.9 Actively engaged in lifestyle modifications. Motivated to continue changes with long-term goal of discontinuing antihypertensive medication. - Continue current lifestyle modifications including dietary changes and increased physical activity. - Encouraged participation in physical activities such as dancing classes.     Hyperlipidemia Current ASCVD risk score 11%. Pt does not want to start statin at present time, would like trial of 2-3 months of lifestyle changes and will recheck labs at follow up - add fish oil and resveratrol - continue dietary changes and weight loss - return in 2 months fasting for recheck    Return in about 8 weeks (around 09/17/2024).   [name], PA

## 2024-08-02 ENCOUNTER — Other Ambulatory Visit: Payer: Self-pay | Admitting: Physician Assistant

## 2024-08-02 DIAGNOSIS — F5101 Primary insomnia: Secondary | ICD-10-CM

## 2024-08-03 ENCOUNTER — Encounter: Payer: Self-pay | Admitting: Physician Assistant

## 2024-08-20 ENCOUNTER — Other Ambulatory Visit: Payer: Self-pay

## 2024-08-20 MED ORDER — LORAZEPAM 1 MG PO TABS
1.0000 mg | ORAL_TABLET | Freq: Once | ORAL | 0 refills | Status: AC
  Filled 2024-08-20: qty 1, 1d supply, fill #0

## 2024-09-01 ENCOUNTER — Other Ambulatory Visit (HOSPITAL_BASED_OUTPATIENT_CLINIC_OR_DEPARTMENT_OTHER): Payer: Self-pay

## 2024-09-18 ENCOUNTER — Ambulatory Visit: Admitting: Physician Assistant

## 2024-10-05 ENCOUNTER — Encounter: Payer: Self-pay | Admitting: Physician Assistant

## 2024-10-05 DIAGNOSIS — F5101 Primary insomnia: Secondary | ICD-10-CM

## 2024-10-05 MED ORDER — ZOLPIDEM TARTRATE 10 MG PO TABS: 10.0000 mg | ORAL_TABLET | Freq: Every evening | ORAL | 0 refills | Status: AC | PRN

## 2024-11-16 ENCOUNTER — Ambulatory Visit: Admitting: Physician Assistant
